# Patient Record
Sex: Male | Born: 1935 | Race: White | Hispanic: No | Smoking: Former smoker
Health system: Southern US, Community
[De-identification: ages and names within clinical notes are randomized; demographics above are authoritative.]

## PROBLEM LIST (undated history)

## (undated) DIAGNOSIS — K219 Gastro-esophageal reflux disease without esophagitis: Secondary | ICD-10-CM

## (undated) DIAGNOSIS — E119 Type 2 diabetes mellitus without complications: Secondary | ICD-10-CM

## (undated) DIAGNOSIS — R809 Proteinuria, unspecified: Secondary | ICD-10-CM

## (undated) DIAGNOSIS — D649 Anemia, unspecified: Secondary | ICD-10-CM

## (undated) DIAGNOSIS — K222 Esophageal obstruction: Secondary | ICD-10-CM

## (undated) DIAGNOSIS — I701 Atherosclerosis of renal artery: Secondary | ICD-10-CM

## (undated) DIAGNOSIS — D696 Thrombocytopenia, unspecified: Secondary | ICD-10-CM

## (undated) DIAGNOSIS — E785 Hyperlipidemia, unspecified: Secondary | ICD-10-CM

## (undated) DIAGNOSIS — E538 Deficiency of other specified B group vitamins: Secondary | ICD-10-CM

## (undated) DIAGNOSIS — I10 Essential (primary) hypertension: Secondary | ICD-10-CM

## (undated) DIAGNOSIS — K5792 Diverticulitis of intestine, part unspecified, without perforation or abscess without bleeding: Secondary | ICD-10-CM

## (undated) DIAGNOSIS — N183 Chronic kidney disease, stage 3 unspecified: Secondary | ICD-10-CM

## (undated) DIAGNOSIS — M109 Gout, unspecified: Secondary | ICD-10-CM

## (undated) HISTORY — DX: Atherosclerosis of renal artery: I70.1

## (undated) HISTORY — PX: EYE SURGERY: SHX253

## (undated) HISTORY — PX: ESOPHAGEAL DILATION: SHX303

## (undated) HISTORY — DX: Anemia, unspecified: D64.9

## (undated) HISTORY — DX: Diverticulitis of intestine, part unspecified, without perforation or abscess without bleeding: K57.92

## (undated) HISTORY — DX: Thrombocytopenia, unspecified: D69.6

## (undated) HISTORY — DX: Esophageal obstruction: K22.2

## (undated) HISTORY — DX: Chronic kidney disease, stage 3 unspecified: N18.30

## (undated) HISTORY — DX: Gout, unspecified: M10.9

## (undated) HISTORY — DX: Proteinuria, unspecified: R80.9

## (undated) HISTORY — DX: Deficiency of other specified B group vitamins: E53.8

## (undated) HISTORY — DX: Chronic kidney disease, stage 3 (moderate): N18.3

## (undated) HISTORY — PX: HERNIA REPAIR: SHX51

## (undated) HISTORY — DX: Type 2 diabetes mellitus without complications: E11.9

---

## 2002-10-13 ENCOUNTER — Encounter: Payer: Self-pay | Admitting: Internal Medicine

## 2002-10-13 ENCOUNTER — Encounter (INDEPENDENT_AMBULATORY_CARE_PROVIDER_SITE_OTHER): Payer: Self-pay | Admitting: Specialist

## 2002-10-13 ENCOUNTER — Ambulatory Visit (HOSPITAL_COMMUNITY): Admission: RE | Admit: 2002-10-13 | Discharge: 2002-10-13 | Payer: Self-pay | Admitting: Internal Medicine

## 2006-05-03 ENCOUNTER — Ambulatory Visit: Payer: Self-pay | Admitting: Internal Medicine

## 2006-05-12 ENCOUNTER — Encounter (INDEPENDENT_AMBULATORY_CARE_PROVIDER_SITE_OTHER): Payer: Self-pay | Admitting: Specialist

## 2006-05-12 ENCOUNTER — Ambulatory Visit: Payer: Self-pay | Admitting: Internal Medicine

## 2006-05-18 ENCOUNTER — Ambulatory Visit: Payer: Self-pay | Admitting: Internal Medicine

## 2006-06-10 ENCOUNTER — Ambulatory Visit: Payer: Self-pay | Admitting: Internal Medicine

## 2006-06-10 ENCOUNTER — Encounter (INDEPENDENT_AMBULATORY_CARE_PROVIDER_SITE_OTHER): Payer: Self-pay | Admitting: Specialist

## 2006-07-05 ENCOUNTER — Ambulatory Visit: Payer: Self-pay | Admitting: Internal Medicine

## 2006-07-22 ENCOUNTER — Ambulatory Visit: Payer: Self-pay | Admitting: Internal Medicine

## 2006-11-25 ENCOUNTER — Ambulatory Visit: Payer: Self-pay | Admitting: Internal Medicine

## 2007-01-07 ENCOUNTER — Ambulatory Visit: Payer: Self-pay | Admitting: Internal Medicine

## 2009-04-23 ENCOUNTER — Ambulatory Visit: Payer: Self-pay | Admitting: Internal Medicine

## 2009-04-23 ENCOUNTER — Telehealth: Payer: Self-pay | Admitting: Internal Medicine

## 2009-04-25 ENCOUNTER — Ambulatory Visit (HOSPITAL_COMMUNITY): Admission: RE | Admit: 2009-04-25 | Discharge: 2009-04-25 | Payer: Self-pay | Admitting: Internal Medicine

## 2009-04-25 ENCOUNTER — Encounter: Payer: Self-pay | Admitting: Internal Medicine

## 2009-04-30 ENCOUNTER — Telehealth (INDEPENDENT_AMBULATORY_CARE_PROVIDER_SITE_OTHER): Payer: Self-pay | Admitting: *Deleted

## 2009-05-01 ENCOUNTER — Ambulatory Visit: Payer: Self-pay | Admitting: Internal Medicine

## 2009-05-07 ENCOUNTER — Encounter: Payer: Self-pay | Admitting: Internal Medicine

## 2009-05-07 DIAGNOSIS — K222 Esophageal obstruction: Secondary | ICD-10-CM

## 2009-05-31 ENCOUNTER — Ambulatory Visit: Payer: Self-pay | Admitting: Internal Medicine

## 2009-05-31 ENCOUNTER — Ambulatory Visit (HOSPITAL_COMMUNITY): Admission: RE | Admit: 2009-05-31 | Discharge: 2009-05-31 | Payer: Self-pay | Admitting: Internal Medicine

## 2009-06-03 ENCOUNTER — Encounter: Payer: Self-pay | Admitting: Internal Medicine

## 2009-06-05 ENCOUNTER — Telehealth (INDEPENDENT_AMBULATORY_CARE_PROVIDER_SITE_OTHER): Payer: Self-pay | Admitting: *Deleted

## 2009-07-03 ENCOUNTER — Ambulatory Visit: Payer: Self-pay | Admitting: Internal Medicine

## 2009-07-03 ENCOUNTER — Ambulatory Visit (HOSPITAL_COMMUNITY): Admission: RE | Admit: 2009-07-03 | Discharge: 2009-07-03 | Payer: Self-pay | Admitting: Internal Medicine

## 2009-11-18 ENCOUNTER — Telehealth: Payer: Self-pay | Admitting: Internal Medicine

## 2009-12-03 ENCOUNTER — Telehealth: Payer: Self-pay | Admitting: Internal Medicine

## 2009-12-05 ENCOUNTER — Encounter: Payer: Self-pay | Admitting: Internal Medicine

## 2010-01-14 ENCOUNTER — Ambulatory Visit (HOSPITAL_COMMUNITY): Admission: RE | Admit: 2010-01-14 | Discharge: 2010-01-14 | Payer: Self-pay | Admitting: Internal Medicine

## 2010-01-14 ENCOUNTER — Ambulatory Visit: Payer: Self-pay | Admitting: Internal Medicine

## 2010-01-15 ENCOUNTER — Encounter (INDEPENDENT_AMBULATORY_CARE_PROVIDER_SITE_OTHER): Payer: Self-pay | Admitting: *Deleted

## 2010-01-15 ENCOUNTER — Telehealth (INDEPENDENT_AMBULATORY_CARE_PROVIDER_SITE_OTHER): Payer: Self-pay | Admitting: *Deleted

## 2010-02-03 ENCOUNTER — Telehealth: Payer: Self-pay | Admitting: Internal Medicine

## 2010-04-08 ENCOUNTER — Ambulatory Visit: Payer: Self-pay | Admitting: Internal Medicine

## 2010-04-08 ENCOUNTER — Ambulatory Visit (HOSPITAL_COMMUNITY): Admission: RE | Admit: 2010-04-08 | Discharge: 2010-04-08 | Payer: Self-pay | Admitting: Internal Medicine

## 2010-04-22 ENCOUNTER — Telehealth (INDEPENDENT_AMBULATORY_CARE_PROVIDER_SITE_OTHER): Payer: Self-pay | Admitting: *Deleted

## 2010-07-08 ENCOUNTER — Ambulatory Visit (HOSPITAL_COMMUNITY): Admission: RE | Admit: 2010-07-08 | Discharge: 2010-07-08 | Payer: Self-pay | Admitting: Internal Medicine

## 2010-07-08 ENCOUNTER — Ambulatory Visit: Payer: Self-pay | Admitting: Internal Medicine

## 2010-09-16 ENCOUNTER — Telehealth: Payer: Self-pay | Admitting: Internal Medicine

## 2010-10-09 ENCOUNTER — Telehealth: Payer: Self-pay | Admitting: Internal Medicine

## 2010-10-16 NOTE — Progress Notes (Signed)
Summary: Rsch'd Endo   Phone Note Call from Patient Call back at Home Phone [phone]   Caller: Patient Call For: Dr. Marina Goodell Reason for Call: Talk to Nurse Summary of Call: needs to r/s his endo that is sch'd on 02-13-10 for another date Initial call taken by: Karna Christmas,  Feb 03, 2010 3:14 PM  Follow-up for Phone Call        Message left for pt. to call back to r/s dil   Teryl Lucy RN  Feb 04, 2010 9:41 AM  Procedure r/s to 04/08/10 at 8:30 .Jill ntfd at [facility] . Follow-up by: Teryl Lucy RN,  Feb 04, 2010 10:55 AM

## 2010-10-16 NOTE — Progress Notes (Signed)
   Phone Note Outgoing Call   Summary of Call: Message left for pt. to call back to schedule balloon dil. in Oct. at [facility]. Initial call taken by: Teryl Lucy RN,  April 22, 2010 11:23 AM  Follow-up for Phone Call        Message left again to call back.  Teryl Lucy RN  April 24, 2010 9:16 AM Pt. ret'd call and he will check with his daughter [id number] for EGD/Dil.  Teryl Lucy RN  April 24, 2010 3:40 PM Scheduled for balloon/dil on 07/08/10 at [facility]..Given instructions via phone. Follow-up by: Teryl Lucy RN,  May 01, 2010 3:13 PM

## 2010-10-16 NOTE — Progress Notes (Signed)
Summary: Medication refill  Medications Added OMEPRAZOLE 20 MG CPDR (OMEPRAZOLE) 2 capsules by mouth daily       Phone Note Call from Patient Call back at Home Phone [phone]   Caller: Patient Call For: Dr. Marina Goodell Reason for Call: Talk to Nurse Summary of Call: Pt needs a refill of omeprazole tp [facility] [address] Initial call taken by: [place] Johnson,  October 09, 2010 11:31 AM  Follow-up for Phone Call        rx sent I have the RX again. Follow-up by: Darcey Nora RN, CGRN,  October 09, 2010 1:52 PM    New/Updated Medications: OMEPRAZOLE 20 MG CPDR (OMEPRAZOLE) 2 capsules by mouth daily Prescriptions: OMEPRAZOLE 20 MG CPDR (OMEPRAZOLE) 2 capsules by mouth daily  #60 x 10   Entered by:   Darcey Nora RN, CGRN   Authorized by:   Hilarie Fredrickson MD   Signed by:   Darcey Nora RN, CGRN on 10/09/2010   Method used:   Electronically to        [facility] (retail)       [address]       [place], [place]  [address]       Ph: [phone]       Fax: [phone]   RxID:   [id number]

## 2010-10-16 NOTE — Procedures (Signed)
Summary: Upper Endoscopy  Patient: Tanner Owen Note: All result statuses are Final unless otherwise noted.  Tests: (1) Upper Endoscopy (EGD)   EGD Upper Endoscopy       DONE     [facility]     [address] [place], [place]  [address]           ENDOSCOPY PROCEDURE REPORT           PATIENT:  Tanner Owen, Tanner Owen  MR#:  [id number]     BIRTHDATE:  11/04/1928, 80 yrs. old  GENDER:  male           ENDOSCOPIST:  Wilhemina Bonito. Eda Keys, MD     Referred by:  .Direct Self,           PROCEDURE DATE:  01/14/2010     PROCEDURE:  EGD with balloon dilatation - 3mm,16.5mm     ASA CLASS:  Class II     INDICATIONS:  dilation of esophageal stricture, dysphagia           MEDICATIONS:   Fentanyl 50 mcg, Versed 5 mg IV     TOPICAL ANESTHETIC:  Cetacaine Spray           DESCRIPTION OF PROCEDURE:   After the risks benefits and     alternatives of the procedure were thoroughly explained, informed     consent was obtained.  The Pentax Gastroscope [id number] endoscope     was introduced through the mouth and advanced to the second     portion of the duodenum, without limitations.  The instrument was     slowly withdrawn as the mucosa was fully examined.     <<PROCEDUREIMAGES>>           A tight 7mm stricture was found in the distal esophagus. The     endoscope would not pass into the stomach (not attempted).     THERAPY: 15MM-16.5MM TTS SEQUENTIAL BALLOON WAS INFLATED IN     SEPERATE SESSIONS. MODERATE RESISTANCE AND HEME WITH LOCAL TRAUMA.     The endoscope was then passed into the stomach. Otherwise the     examination was unremarkable.    Retroflexed views revealed a     hiatal hernia.    The scope was then withdrawn from the patient     and the procedure completed.           COMPLICATIONS:  None           ENDOSCOPIC IMPRESSION:     1) Stricture in the distal esophagus -S/P DILATION W/15-16.5MM     BALLOON     2) Otherwise unremarkable examination     3) A small hiatal hernia     4)  Gerd     RECOMMENDATIONS:     1) Clear liquids until 11 am, then soft foods rest of day.     Resume prior diet tomorrow.     2) continue current medications for reflux     3) EGD/DILATION   at HOSPITAL in 6-8 weeks           ______________________________     Wilhemina Bonito. Eda Keys, MD           CC:  Rodrigo Ran, MD, The Patient           n.     eSIGNED:   Wilhemina Bonito. Eda Keys at 01/14/2010 09:25 AM           Levonne Lapping, [id number]  Note: An exclamation mark (!) indicates a result that was not dispersed into the flowsheet. Document Creation Date: 01/14/2010 9:26 AM _______________________________________________________________________  (1) Order result status: Final Collection or observation date-time: 01/14/2010 09:15 Requested date-time:  Receipt date-time:  Reported date-time:  Referring Physician:   Ordering Physician: Fransico Setters [phone]) Specimen Source:  Source: Launa Grill Order Number: [id number] Lab site:

## 2010-10-16 NOTE — Progress Notes (Signed)
Summary: sprak to nurse   Phone Note Call from Patient Call back at Home Phone [phone]   Caller: Patient Call For: Marina Goodell Reason for Call: Talk to Nurse Summary of Call: Patietn wants to speak to nurse regarding appt Initial call taken by: Tawni Levy,  December 03, 2009 3:15 PM  Follow-up for Phone Call        Pt. given available dates for first week of May to have balloon dil. at hosp. and will call back after he talks to his children to see who can bring him. Follow-up by: Teryl Lucy RN,  December 03, 2009 3:30 PM     Appended Document: sprak to nurse  Pt. scheduled for balloon-dil on 01/14/2010 at 8:30 am.at [facility].Given instructions on the phone.

## 2010-10-16 NOTE — Progress Notes (Signed)
   Phone Note Outgoing Call   Call placed by: Milford Cage NCMA,  September 16, 2010 8:40 AM Call placed to: Patient Summary of Call: called patient to schedule follow-up EGD with Dil. Pt. declined.   He states that he is doing fine at this time and will call if he starts having problems to schedule.   Initial call taken by: Milford Cage NCMA,  September 16, 2010 8:42 AM

## 2010-10-16 NOTE — Procedures (Signed)
Summary: Upper Endoscopy  Patient: Tanner Owen Note: All result statuses are Final unless otherwise noted.  Tests: (1) Upper Endoscopy (EGD)   EGD Upper Endoscopy       DONE     [facility]     [address] [place], [place]  [address]           ENDOSCOPY PROCEDURE REPORT           PATIENT:  [name], [name]  MR#:  [id number]     BIRTHDATE:  [birth year], 81 yrs. old  GENDER:  male           ENDOSCOPIST:  Wilhemina Bonito. Eda Keys, MD     Referred by:  .Direct           PROCEDURE DATE:  07/08/2010     PROCEDURE:  EGD with balloon dilatation - 15-16-17MM     ASA CLASS:  Class II     INDICATIONS:  dilation of esophageal stricture           MEDICATIONS:   Fentanyl 50 mcg IV, Versed 6 mg IV     TOPICAL ANESTHETIC:  Cetacaine Spray           DESCRIPTION OF PROCEDURE:   After the risks benefits and     alternatives of the procedure were thoroughly explained, informed     consent was obtained.  The Pentax Gastroscope [id number] endoscope     was introduced through the mouth and advanced to the second     portion of the duodenum, without limitations.  The instrument was     slowly withdrawn as the mucosa was fully examined.     <<PROCEDUREIMAGES>>           A 9mm - 10mm stricture was found in the distal esophagus.     Otherwise the examination was normal to D2.    Retroflexed views     revealed a small hiatal hernia.           THERAPY: SEQUENTIAL INDIVIDUAL DILATION OF TTS BALLOON -     15MM-16MM-17MM. MODERATE RESISTANCE AND MILD HEME. TOLERATED WELL     COMPLICATIONS:  None           ENDOSCOPIC IMPRESSION:     1) Stricture in the distal esophagus - S/P DILT TO     2) Otherwise normal examination     3) Gerd     RECOMMENDATIONS:     1) Clear liquids until 1PM, then soft foods rest of day. Resume     prior diet tomorrow.     2) continue current medications FOR REFLUX     3) REPEAT Endoscopy WITH DILATION IN 2-3 MONTHS           ______________________________  Wilhemina Bonito. Eda Keys, MD           CC:  Rodrigo Ran, MD, The Patient           n.     eSIGNED:   Wilhemina Bonito. Eda Keys at 07/08/2010 10:59 AM           Levonne Lapping, [id number]  Note: An exclamation mark (!) indicates a result that was not dispersed into the flowsheet. Document Creation Date: 07/08/2010 10:59 AM _______________________________________________________________________  (1) Order result status: Final Collection or observation date-time: 07/08/2010 10:50 Requested date-time:  Receipt date-time:  Reported date-time:  Referring Physician:   Ordering Physician: Fransico Setters [phone]) Specimen Source:  Source: Launa Grill Order  Number: [id number] Lab site:

## 2010-10-16 NOTE — Letter (Signed)
Summary: EGD Instructions  [facility]  [address] [place], [place] [address]   Phone: [phone]  Fax: [phone]       Tanner Owen    08-25-1929    MRN: [id number]       Procedure Day /Date:03/04/2010     Arrival Time: 7:30 a.m.     Procedure Time:8:30 am     Location of Procedure:                    _  [facility] (4th [facility]) _  X_ [facility] ( Outpatient Registration) _  _ [facility] (Short Stay on [address]   PREPARATION FOR ENDOSCOPY/Dilation   On Tuesday THE DAY OF THE PROCEDURE:  1.   No solid foods, milk or milk products are allowed after midnight the night before your procedure.  2.   Do not drink anything colored red or purple.  Avoid juices with pulp.  No orange juice.  3.  You may drink clear liquids until  4:30 a.m., which is 4 hours before your procedure.                                                                                                CLEAR LIQUIDS INCLUDE: Water Jello Ice Popsicles Tea (sugar ok, no milk/cream) Powdered fruit flavored drinks Coffee (sugar ok, no milk/cream) Gatorade Juice: apple, white grape, white cranberry  Lemonade Clear bullion, consomm, broth Carbonated beverages (any kind) Strained chicken noodle soup Hard Candy   MEDICATION INSTRUCTIONS  Unless otherwise instructed, you should take regular prescription medications with a small sip of water as early as possible the morning of your procedure.           OTHER INSTRUCTIONS  You will need a responsible adult at least 75 years of age to accompany you and drive you home.   This person must remain in the waiting room during your procedure.  Wear loose fitting clothing that is easily removed.  Leave jewelry and other valuables at home.  However, you may wish to bring a book to read or an iPod/MP3 player to listen to music as you wait for your procedure to start.  Remove all body piercing jewelry  and leave at home.  Total time from sign-in until discharge is approximately 2-3 hours.  You should go home directly after your procedure and rest.  You can resume normal activities the day after your procedure.  The day of your procedure you should not:   Drive   Make legal decisions   Operate machinery   Drink alcohol   Return to work  You will receive specific instructions about eating, activities and medications before you leave.    The above instructions have been reviewed and explained to me by   [facility] phone 5/4/11____________    I fully understand and can verbalize these instructions _____________________________ Date _________

## 2010-10-16 NOTE — Procedures (Signed)
Summary: Upper Endoscopy  Patient: Tanner Owen Note: All result statuses are Final unless otherwise noted.  Tests: (1) Upper Endoscopy (EGD)   EGD Upper Endoscopy       DONE     [facility]     [address] [place], [place]  [address]           ENDOSCOPY PROCEDURE REPORT           PATIENT:  Tanner Owen, Tanner Owen  MR#:  [id number]     BIRTHDATE:  April 25, 1929, 80 yrs. old  GENDER:  male           ENDOSCOPIST:  Wilhemina Bonito. Eda Keys, MD     Referred by:  .Direct           PROCEDURE DATE:  04/08/2010     PROCEDURE:  EGD with balloon dilatation - TTS 15-16.56mm     ASA CLASS:  Class II     INDICATIONS:  dysphagia, dilation of esophageal stricture           MEDICATIONS:   Fentanyl 50 mcg IV, Versed 6 mg     TOPICAL ANESTHETIC:  Cetacaine Spray           DESCRIPTION OF PROCEDURE:   After the risks benefits and     alternatives of the procedure were thoroughly explained, informed     consent was obtained.  The Gastroscope [id number]     endoscope was introduced through the mouth and advanced to the     second portion of the duodenum, without limitations.  The     instrument was slowly withdrawn as the mucosa was fully examined.     <<PROCEDUREIMAGES>>           A tight focal stricture measuring about 6mm was found in the     distal esophagus. After dilation  with a TTS balloon 15mm x 2,     then 16.5 mm x 2 the stomach was entered.  Otherwise the     examination was normal.    Retroflexed views revealed no     abnormalities.    The scope was then withdrawn from the patient     and the procedure completed.           COMPLICATIONS:  None           ENDOSCOPIC IMPRESSION:     1) Stricture in the distal esophagus - s/p dilation to 16.73mm     2) Otherwise normal examination     3) Gerd     RECOMMENDATIONS:     1) Clear liquids until 11am, then soft foods rest of day. Resume     prior diet tomorrow.     2) continue current medications for reflux     3) Repear endoscopy with  balloon dilation in 6-8 weeks           ______________________________     Wilhemina Bonito. Eda Keys, MD           CC:  Rodrigo Ran, MD, The Patient           n.     eSIGNED:   Wilhemina Bonito. Eda Keys at 04/08/2010 09:06 AM           Levonne Lapping, [id number]  Note: An exclamation mark (!) indicates a result that was not dispersed into the flowsheet. Document Creation Date: 04/08/2010 9:07 AM _______________________________________________________________________  (1) Order result status: Final Collection or observation date-time: 04/08/2010 08:56  Requested date-time:  Receipt date-time:  Reported date-time:  Referring Physician:   Ordering Physician: Fransico Setters [phone]) Specimen Source:  Source: Launa Grill Order Number: [id number] Lab site:

## 2010-10-16 NOTE — Miscellaneous (Signed)
Summary: EGD/Dil order  Clinical Lists Changes  Orders: Added new Test order of ZEGD Balloon Dil (ZEGD Balloon) - Signed

## 2010-10-16 NOTE — Progress Notes (Signed)
   Phone Note Outgoing Call   Summary of Call: Pt. scheduled for balloon dil again on June 21/2011at 8:30 am at [facility].Pt.is aware and instructed. Initial call taken by: Teryl Lucy RN,  Jan 15, 2010 2:50 PM

## 2010-10-16 NOTE — Progress Notes (Signed)
Summary: sch egd at [facility]   Phone Note Call from Patient Call back at Home Phone [phone]   Caller: Patient Call For: Marina Goodell Reason for Call: Talk to Nurse Summary of Call: Patient needs to scheduled f.u egd at hosp Initial call taken by: Tawni Levy,  November 18, 2009 3:36 PM  Follow-up for Phone Call         Dr.Perry is not in the hospital in April so he will call back to schedule in May as he says Dr.Perry says he is to have procedure 2x/yr. NO dysphagia currently. Follow-up by: Teryl Lucy RN,  November 19, 2009 8:01 AM

## 2010-10-16 NOTE — Progress Notes (Signed)
 ----   Converted from flag ---- ---- 07/08/2010 4:41 PM, Milford Cage NCMA wrote: schedule patient for EGD with Dilt. at [facility]. Stricture/GERD ------------------------------  Phone Note Outgoing Call   Call placed by: Milford Cage NCMA,  September 16, 2010 8:53 AM Call placed to: Patient Summary of Call: pt. declined.  see phone note. Initial call taken by: Milford Cage NCMA,  September 16, 2010 8:53 AM

## 2010-11-26 LAB — GLUCOSE, CAPILLARY: Glucose-Capillary: 168 mg/dL — ABNORMAL HIGH (ref 70–99)

## 2010-12-02 LAB — GLUCOSE, CAPILLARY: Glucose-Capillary: 150 mg/dL — ABNORMAL HIGH (ref 70–99)

## 2010-12-19 LAB — GLUCOSE, CAPILLARY: Glucose-Capillary: 125 mg/dL — ABNORMAL HIGH (ref 70–99)

## 2011-01-30 NOTE — Assessment & Plan Note (Signed)
[facility]                           GASTROENTEROLOGY OFFICE NOTE   NAME:Tanner Owen                     MRN:          [id number]  DATE:05/03/2006                            DOB:          05/27/1930    REFERRING PHYSICIAN:  Loraine Leriche A. Perini, MD   REASON FOR CONSULTATION:  Dysphagia.   HISTORY:  This is a 77 year old white male with a history of hypertension,  diabetes mellitus, adenomatous colon polyps, and gastroesophageal reflux  disease complicated by peptic stricture, who was referred through the  courtesy of Dr. Waynard Edwards regarding dysphagia.  The patient's old chart is not  currently available for review; however, some old records were retrieved  from the computer database.  The patient was evaluated in December, 2003 for  weight loss and dysphagia.  On August 17, 2002 he underwent colonoscopy and  upper endoscopy.  Colonoscopy revealed multiple colon polyps measuring as  large as 15 mm.  He was found to have tubulovillous and villous adenomas.  Followup in one year recommended.  Patient acknowledges receiving a recall  letter but consciously chose not to follow up.  His upper endoscopy on that  same day revealed distal esophageal stricture as well as a large squamous  papilloma.  Followup endoscopy a month later found that the previous polyp  had autoamputated with only a small stock present.  Biopsies of that small  stock confirmed squamous papilloma.  The distal esophagus was dilated with a  series of Savary dilators.  His dysphagia apparently resolved.  He was  placed on Nexium, which he took for a while, then discontinued.  It is not  clear that he had office followup, as requested.  In any event, he states  that he has been on Nexium over the past few years fairly routinely.  Over  the past month or so, he has noticed intermittent dysphagia to solids.  He  has had a 10 pound weight loss over the past six months.  His only other  complaints are increased intestinal gas.   PAST MEDICAL HISTORY:  As above.   PAST SURGICAL HISTORY:  None.   FAMILY HISTORY:  Negative for GI malignancy.   SOCIAL HISTORY:  Divorced with three children.  Eighth grade education.  Does not smoke or use alcohol.   REVIEW OF SYSTEMS:  Per diagnostic evaluation form.   ALLERGIES:  No known drug allergies.   CURRENT MEDICATIONS:  1. Hydrochlorothiazide 25 mg daily.  2. Propranolol 80 mg b.i.d.  3. Enalapril 5 mg daily.  4. Aspirin 81 mg daily.  5. Crestor 2.5 mg daily.  6. Nexium 40 mg daily.  7. Metformin 500 mg daily.  8. Tylenol p.r.n.   PHYSICAL EXAMINATION:  VITAL SIGNS:  Blood pressure 120/80, heart rate 68  and regular.  Weight is 162.2 pounds.  He is 5 feet 11 inches in height.  GENERAL:  Elderly man in no acute distress.  HEENT:  Sclerae are anicteric.  Conjunctivae are pink.  Oral mucosa intact.  There is no thrush.  No adenopathy.  LUNGS:  Clear.  HEART:  Regular.  ABDOMEN:  Soft without tenderness, mass, or hernia.  Good bowel sounds  heard.  EXTREMITIES:  Without edema.   IMPRESSION:  1. A history of gastroesophageal reflux disease complicated by peptic      stricture.  2. History of squamous papilloma of the esophagus.  3. Currently with recurrent dysphagia, likely due to recurrent peptic      stricture.  4. History of multiple villous adenomatous colon polyps, overdue for      surveillance, acknowledged by patient.  5. Weight loss of uncertain cause.   RECOMMENDATIONS:  1. Continue Nexium.  2. Schedule upper endoscopy with possible esophageal dilation.  The nature      of the procedure as well as the risks, benefits and alternatives were      reviewed.  He understood and agreed to proceed.  3. Discussed in detail the importance of surveillance colonoscopy.  The      patient mentioned that he had an aversion to the preparation.  I      offered him multiple prep options.  He still declined to proceed at       this time, even though I informed him that he may be at risk for colon      cancer.  He said he might consider it after his upper endoscopic      procedure is completed.  4. Ongoing general medical care with Dr. Waynard Edwards.                                   Wilhemina Bonito. Eda Keys., MD   JNP/MedQ  DD:  05/03/2006  DT:  05/04/2006  Job #:  [id number]   cc:   Loraine Leriche A. Perini, MD

## 2011-01-30 NOTE — Assessment & Plan Note (Signed)
[facility]                         GASTROENTEROLOGY OFFICE NOTE   NAME:Tanner Owen, Tanner Owen                    MRN:          [id number]  DATE:11/25/2006                            DOB:          November 01, 1928    REASON FOR CONSULTATION:  Hemoccult positive stool.   HISTORY:  This is a 77 year old white male with a history of  hypertension, diabetes mellitus, adenomatous colon polyps, and  gastroesophageal reflux disease complicated by erosive esophagitis and  peptic stricture for which the patient has undergone a prior endoscopy  with esophageal dilation.  He is now referred through the courtesy of  Dr. Waynard Edwards regarding hemoccult positive stool.  The patient was last  evaluated in November 2007, when he underwent upper endoscopy with  esophageal dilation of a distal esophageal stricture.  Since that time,  he has been maintained on daily proton pump inhibitor therapy in the  form of omeprazole.  He denied indigestion or heartburn.  He has had  minimal problems with recurrent dysphagia.  He has had weight gain.  He  recently saw Dr. Waynard Edwards and was found to have hemoccult positive stool  on home testing.  A CBC revealed a normal hemoglobin of 13.3.   The patient's GI review of systems is currently remarkable for  intermittent problems with constipation for which he takes Ex-Lax and  MiraLax as needed.  He denies any significant abdominal pain.  No gross  GI bleeding.   He is aware that he is over due for surveillance colonoscopy, having  declined previously.  He is now interested in that exam.   PAST MEDICAL HISTORY:  As above.   PAST SURGICAL HISTORY:  None.   FAMILY HISTORY:  Negative for GI malignancy.   SOCIAL HISTORY:  Divorced with 3 children.  Eighth grade education.  He  does not smoke or use alcohol.   ALLERGIES:  NO KNOWN DRUG ALLERGIES.   CURRENT MEDICATIONS:  1. Propranolol 160 mg daily.  2. Hydrochlorothiazide 25 mg daily.  3.  Enalapril 5 mg daily.  4. Aspirin 81 mg daily.  5. Metformin 500 mg daily.  6. Crestor 5 mg daily.  7. Omeprazole 40 mg daily.  8. He also uses Ex-Lax and MiraLax p.r.n.   PHYSICAL EXAMINATION:  GENERAL:  Well-appearing male, no acute distress.  VITAL SIGNS:  Blood pressure 158/88, heart rate is 56 and regular,  weight is 164.6 pounds.  HEENT:  Sclerae are anicteric.  Conjunctivae are pink.  Oral mucosa  intact.  No adenopathy.  LUNGS:  Clear.  HEART:  Regular.  ABDOMEN:  Soft without tenderness, mass, or hernia.  Good bowel sounds  heard.  EXTREMITIES:  Without edema.   IMPRESSION:  1. Hemoccult positive stool.  This could be related to an occult      colonic lesion.  Alternatively, this may be related to erosive      esophagitis despite omeprazole therapy.  He is not anemic.  2. Constipation, likely functional, rule out occult colonic lesion.  3. History of adenomatous colon polyps overdue for surveillance.  4. Diabetes mellitus and other general medical problems.   RECOMMENDATIONS:  1. Continue omeprazole for reflux symptoms.  2. Colonoscopy with polypectomy if necessary.  The nature of the      procedure as well as the risks, benefits, and alternatives were      discussed in great detail.  He understood and agreed to proceed.  3. Hold Metformin the day of his procedure.  4. Repeat upper endoscopy indicated if the patient develops any      significant recurrent dysphagia.  5. Ongoing general medical care with Dr. Waynard Edwards.     Wilhemina Bonito. Marina Goodell, MD  Electronically Signed    JNP/MedQ  DD: 11/25/2006  DT: 11/26/2006  Job #: [id number]   cc:   Loraine Leriche A. Perini, M.D.

## 2011-06-04 ENCOUNTER — Encounter (HOSPITAL_BASED_OUTPATIENT_CLINIC_OR_DEPARTMENT_OTHER): Payer: Medicare Other | Admitting: Oncology

## 2011-06-04 ENCOUNTER — Other Ambulatory Visit: Payer: Self-pay | Admitting: Oncology

## 2011-06-04 ENCOUNTER — Encounter: Payer: Self-pay | Admitting: Oncology

## 2011-06-04 DIAGNOSIS — D539 Nutritional anemia, unspecified: Secondary | ICD-10-CM

## 2011-06-04 DIAGNOSIS — D6959 Other secondary thrombocytopenia: Secondary | ICD-10-CM

## 2011-06-04 LAB — CBC WITH DIFFERENTIAL/PLATELET
Eosinophils Absolute: 0.1 10*3/uL (ref 0.0–0.5)
MCV: 81.4 fL (ref 79.3–98.0)
MONO%: 9.2 % (ref 0.0–14.0)
NEUT#: 3.6 10*3/uL (ref 1.5–6.5)
RBC: 4.13 10*6/uL — ABNORMAL LOW (ref 4.20–5.82)
RDW: 14.9 % — ABNORMAL HIGH (ref 11.0–14.6)
WBC: 5.3 10*3/uL (ref 4.0–10.3)
lymph#: 1.1 10*3/uL (ref 0.9–3.3)

## 2011-06-04 LAB — CHCC SMEAR

## 2011-06-05 LAB — COMPREHENSIVE METABOLIC PANEL
BUN: 23 mg/dL (ref 6–23)
CO2: 31 mEq/L (ref 19–32)
Glucose, Bld: 222 mg/dL — ABNORMAL HIGH (ref 70–99)
Sodium: 132 mEq/L — ABNORMAL LOW (ref 135–145)
Total Bilirubin: 1.1 mg/dL (ref 0.3–1.2)
Total Protein: 6.9 g/dL (ref 6.0–8.3)

## 2011-06-05 LAB — IRON AND TIBC
%SAT: 22 % (ref 20–55)
Iron: 82 ug/dL (ref 42–165)
UIBC: 298 ug/dL (ref 125–400)

## 2011-06-05 LAB — ERYTHROPOIETIN: Erythropoietin: 20.4 m[IU]/mL (ref 2.6–34.0)

## 2011-06-05 LAB — LACTATE DEHYDROGENASE: LDH: 120 U/L (ref 94–250)

## 2011-06-05 LAB — VITAMIN B12: Vitamin B-12: 265 pg/mL (ref 211–911)

## 2011-08-27 ENCOUNTER — Other Ambulatory Visit: Payer: Self-pay | Admitting: Internal Medicine

## 2011-12-23 ENCOUNTER — Other Ambulatory Visit: Payer: Self-pay | Admitting: Internal Medicine

## 2012-01-01 ENCOUNTER — Encounter (HOSPITAL_COMMUNITY): Payer: Self-pay

## 2012-01-01 ENCOUNTER — Encounter (INDEPENDENT_AMBULATORY_CARE_PROVIDER_SITE_OTHER): Payer: Medicare Other | Admitting: Ophthalmology

## 2012-01-01 DIAGNOSIS — H1820 Unspecified corneal edema: Secondary | ICD-10-CM

## 2012-01-01 DIAGNOSIS — H33309 Unspecified retinal break, unspecified eye: Secondary | ICD-10-CM

## 2012-01-01 DIAGNOSIS — H2700 Aphakia, unspecified eye: Secondary | ICD-10-CM

## 2012-01-01 DIAGNOSIS — S0550XA Penetrating wound with foreign body of unspecified eyeball, initial encounter: Secondary | ICD-10-CM

## 2012-01-01 DIAGNOSIS — H43819 Vitreous degeneration, unspecified eye: Secondary | ICD-10-CM

## 2012-01-01 NOTE — H&P (Signed)
Tanner Owen is an 78 y.o. male.   Chief Complaint: Poor vision OS since cataract surgery HPI: Cataract surgery OS 12-30-2011 and there is retained lens material in the eye  No past medical history on file.  No past surgical history on file.  No family history on file. Social History:  does not have a smoking history on file. He does not have any smokeless tobacco history on file. His alcohol and drug histories not on file.  Allergies: Allergies not on file  Medications Prior to Admission  Medication Sig Dispense Refill  . omeprazole (PRILOSEC) 20 MG capsule TAKE TWO CAPSULES BY MOUTH DAILY  60 capsule  0   No current facility-administered medications on file as of 01/01/2012.    Review of systems otherwise negative  There were no vitals taken for this visit.  Physical exam: Mental status: oriented x3. Eyes: See eye exam associated with this date of surgery in media tab.  Scanned in by scanning center Ears, Nose, Throat: within normal limits Neck: Within Normal limits General: within normal limits Chest: Within normal limits Breast: deferred Heart: Within normal limits Abdomen: Within normal limits GU: deferred Extremities: within normal limits Skin: within normal limits  Assessment/Plan Retained lens material in vitreous.  Lens nucleus in vitreous Plan: To [facility] for Pars Plana vitrectomy with phacoemulsification and possible suture of secondary implant Left eye  Sherrie George 01/01/2012, 12:49 PM

## 2012-01-04 ENCOUNTER — Encounter (INDEPENDENT_AMBULATORY_CARE_PROVIDER_SITE_OTHER): Payer: Medicare Other | Admitting: Ophthalmology

## 2012-01-04 DIAGNOSIS — H2700 Aphakia, unspecified eye: Secondary | ICD-10-CM

## 2012-01-04 DIAGNOSIS — S0550XA Penetrating wound with foreign body of unspecified eyeball, initial encounter: Secondary | ICD-10-CM

## 2012-01-04 DIAGNOSIS — H43819 Vitreous degeneration, unspecified eye: Secondary | ICD-10-CM

## 2012-01-04 MED ORDER — CEFAZOLIN SODIUM 1-5 GM-% IV SOLN
1.0000 g | INTRAVENOUS | Status: AC
  Administered 2012-01-05: 1 g via INTRAVENOUS
  Filled 2012-01-04: qty 50

## 2012-01-05 ENCOUNTER — Encounter (HOSPITAL_COMMUNITY): Payer: Self-pay | Admitting: Certified Registered Nurse Anesthetist

## 2012-01-05 ENCOUNTER — Ambulatory Visit (HOSPITAL_COMMUNITY): Payer: Medicare Other

## 2012-01-05 ENCOUNTER — Encounter (HOSPITAL_COMMUNITY): Payer: Self-pay | Admitting: *Deleted

## 2012-01-05 ENCOUNTER — Encounter (HOSPITAL_COMMUNITY)
Admission: RE | Disposition: A | Discharge status: Home / Self Care | Payer: Self-pay | Source: Ambulatory Visit | Attending: Ophthalmology

## 2012-01-05 ENCOUNTER — Ambulatory Visit (HOSPITAL_COMMUNITY)
Admission: RE | Admit: 2012-01-05 | Discharge: 2012-01-06 | Disposition: A | Discharge status: Home / Self Care | Payer: Medicare Other | Source: Ambulatory Visit | Attending: Ophthalmology | Admitting: Ophthalmology

## 2012-01-05 ENCOUNTER — Ambulatory Visit (HOSPITAL_COMMUNITY): Payer: Medicare Other | Admitting: Certified Registered Nurse Anesthetist

## 2012-01-05 DIAGNOSIS — K219 Gastro-esophageal reflux disease without esophagitis: Secondary | ICD-10-CM | POA: Insufficient documentation

## 2012-01-05 DIAGNOSIS — H59029 Cataract (lens) fragments in eye following cataract surgery, unspecified eye: Secondary | ICD-10-CM

## 2012-01-05 DIAGNOSIS — I10 Essential (primary) hypertension: Secondary | ICD-10-CM | POA: Insufficient documentation

## 2012-01-05 DIAGNOSIS — Y838 Other surgical procedures as the cause of abnormal reaction of the patient, or of later complication, without mention of misadventure at the time of the procedure: Secondary | ICD-10-CM | POA: Insufficient documentation

## 2012-01-05 DIAGNOSIS — H59022 Cataract (lens) fragments in eye following cataract surgery, left eye: Secondary | ICD-10-CM

## 2012-01-05 DIAGNOSIS — T8529XA Other mechanical complication of intraocular lens, initial encounter: Secondary | ICD-10-CM | POA: Insufficient documentation

## 2012-01-05 DIAGNOSIS — K08109 Complete loss of teeth, unspecified cause, unspecified class: Secondary | ICD-10-CM | POA: Insufficient documentation

## 2012-01-05 DIAGNOSIS — E119 Type 2 diabetes mellitus without complications: Secondary | ICD-10-CM

## 2012-01-05 HISTORY — DX: Gastro-esophageal reflux disease without esophagitis: K21.9

## 2012-01-05 HISTORY — DX: Essential (primary) hypertension: I10

## 2012-01-05 HISTORY — PX: GAS/FLUID EXCHANGE: SHX5334

## 2012-01-05 HISTORY — DX: Hyperlipidemia, unspecified: E78.5

## 2012-01-05 HISTORY — PX: PLACEMENT AND SUTURE OF SECONDARY INTRAOCULAR LENS: SHX5338

## 2012-01-05 HISTORY — PX: PARS PLANA VITRECTOMY: SHX2166

## 2012-01-05 LAB — BASIC METABOLIC PANEL
BUN: 45 mg/dL — ABNORMAL HIGH (ref 6–23)
Chloride: 92 mEq/L — ABNORMAL LOW (ref 96–112)
GFR calc Af Amer: 28 mL/min — ABNORMAL LOW (ref >90)
Potassium: 2.7 mEq/L — CL (ref 3.5–5.1)

## 2012-01-05 LAB — GLUCOSE, CAPILLARY
Glucose-Capillary: 128 mg/dL — ABNORMAL HIGH (ref 70–99)
Glucose-Capillary: 122 mg/dL — ABNORMAL HIGH (ref 70–99)

## 2012-01-05 LAB — CBC
HCT: 37.7 % — ABNORMAL LOW (ref 39.0–52.0)
Hemoglobin: 13.7 g/dL (ref 13.0–17.0)
MCHC: 36.3 g/dL — ABNORMAL HIGH (ref 30.0–36.0)

## 2012-01-05 LAB — SURGICAL PCR SCREEN: MRSA, PCR: NEGATIVE

## 2012-01-05 SURGERY — GAS/FLUID EXCHANGE
Anesthesia: General | Site: Eye | Laterality: Left | Wound class: Clean

## 2012-01-05 MED ORDER — ACETAMINOPHEN 325 MG PO TABS
325.0000 mg | ORAL_TABLET | ORAL | Status: DC | PRN
Start: 2012-01-05 — End: 2012-01-06

## 2012-01-05 MED ORDER — LATANOPROST 0.005 % OP SOLN
1.0000 [drp] | Freq: Every day | OPHTHALMIC | Status: DC
  Filled 2012-01-05: qty 2.5

## 2012-01-05 MED ORDER — PROPOFOL 10 MG/ML IV EMUL
INTRAVENOUS | Status: DC | PRN
  Administered 2012-01-05: 180 mg via INTRAVENOUS

## 2012-01-05 MED ORDER — FENTANYL CITRATE 0.05 MG/ML IJ SOLN
INTRAMUSCULAR | Status: DC | PRN
  Administered 2012-01-05: 100 ug via INTRAVENOUS

## 2012-01-05 MED ORDER — BUPIVACAINE HCL 0.75 % IJ SOLN
INTRAMUSCULAR | Status: DC | PRN
  Administered 2012-01-05: 10 mL

## 2012-01-05 MED ORDER — LINAGLIPTIN 5 MG PO TABS
5.0000 mg | ORAL_TABLET | Freq: Once | ORAL | Status: AC
  Administered 2012-01-06: 5 mg via ORAL
  Filled 2012-01-05: qty 1

## 2012-01-05 MED ORDER — TEMAZEPAM 15 MG PO CAPS: 15.0000 mg | ORAL_CAPSULE | Freq: Every evening | ORAL | Status: DC | PRN

## 2012-01-05 MED ORDER — SODIUM CHLORIDE 0.9 % IJ SOLN
INTRAMUSCULAR | Status: DC | PRN
  Administered 2012-01-05: 15:00:00

## 2012-01-05 MED ORDER — POTASSIUM CHLORIDE 10 MEQ/100ML IV SOLN
10.0000 meq | INTRAVENOUS | Status: AC
  Administered 2012-01-05: 10 meq via INTRAVENOUS
  Filled 2012-01-05 (×3): qty 100

## 2012-01-05 MED ORDER — PROVISC 10 MG/ML IO SOLN
INTRAOCULAR | Status: DC | PRN
  Administered 2012-01-05: .85 mL via INTRAOCULAR

## 2012-01-05 MED ORDER — LIDOCAINE HCL (CARDIAC) 20 MG/ML IV SOLN
INTRAVENOUS | Status: DC | PRN
  Administered 2012-01-05: 80 mg via INTRAVENOUS

## 2012-01-05 MED ORDER — EPHEDRINE SULFATE 50 MG/ML IJ SOLN
INTRAMUSCULAR | Status: DC | PRN
  Administered 2012-01-05 (×2): 5 mg via INTRAVENOUS
  Administered 2012-01-05: 10 mg via INTRAVENOUS
  Administered 2012-01-05: 15 mg via INTRAVENOUS
  Administered 2012-01-05: 10 mg via INTRAVENOUS
  Administered 2012-01-05: 5 mg via INTRAVENOUS

## 2012-01-05 MED ORDER — MAGNESIUM HYDROXIDE 400 MG/5ML PO SUSP: 15.0000 mL | Freq: Four times a day (QID) | ORAL | Status: DC | PRN

## 2012-01-05 MED ORDER — MUPIROCIN 2 % EX OINT
TOPICAL_OINTMENT | Freq: Once | CUTANEOUS | Status: AC
  Administered 2012-01-05: 1 via NASAL

## 2012-01-05 MED ORDER — POTASSIUM CHLORIDE CRYS ER 20 MEQ PO TBCR
20.0000 meq | EXTENDED_RELEASE_TABLET | Freq: Two times a day (BID) | ORAL | Status: DC
  Administered 2012-01-06 (×2): 20 meq via ORAL
  Filled 2012-01-05 (×3): qty 1

## 2012-01-05 MED ORDER — MORPHINE SULFATE 2 MG/ML IJ SOLN: 1.0000 mg | INTRAMUSCULAR | Status: DC | PRN

## 2012-01-05 MED ORDER — SODIUM CHLORIDE 0.9 % IV SOLN
INTRAVENOUS | Status: DC | PRN
  Administered 2012-01-05 (×3): via INTRAVENOUS

## 2012-01-05 MED ORDER — NEOSTIGMINE METHYLSULFATE 1 MG/ML IJ SOLN
INTRAMUSCULAR | Status: DC | PRN
  Administered 2012-01-05: 3.5 mg via INTRAVENOUS

## 2012-01-05 MED ORDER — ONDANSETRON HCL 4 MG/2ML IJ SOLN
INTRAMUSCULAR | Status: DC | PRN
  Administered 2012-01-05: 4 mg via INTRAVENOUS

## 2012-01-05 MED ORDER — HEMOSTATIC AGENTS (NO CHARGE) OPTIME
TOPICAL | Status: DC | PRN
  Administered 2012-01-05: 1 via TOPICAL

## 2012-01-05 MED ORDER — LISINOPRIL 5 MG PO TABS
5.0000 mg | ORAL_TABLET | Freq: Once | ORAL | Status: AC
  Administered 2012-01-06: 5 mg via ORAL
  Filled 2012-01-05: qty 1

## 2012-01-05 MED ORDER — BRIMONIDINE TARTRATE 0.2 % OP SOLN
1.0000 [drp] | Freq: Two times a day (BID) | OPHTHALMIC | Status: DC
  Administered 2012-01-06: 1 [drp] via OPHTHALMIC
  Filled 2012-01-05: qty 5

## 2012-01-05 MED ORDER — ONDANSETRON HCL 4 MG/2ML IJ SOLN: 4.0000 mg | Freq: Four times a day (QID) | INTRAMUSCULAR | Status: DC | PRN

## 2012-01-05 MED ORDER — TROPICAMIDE 1 % OP SOLN
1.0000 [drp] | OPHTHALMIC | Status: AC | PRN
  Administered 2012-01-05 (×3): 1 [drp] via OPHTHALMIC
  Filled 2012-01-05: qty 3

## 2012-01-05 MED ORDER — GATIFLOXACIN 0.5 % OP SOLN
1.0000 [drp] | Freq: Four times a day (QID) | OPHTHALMIC | Status: DC
  Administered 2012-01-06: 1 [drp] via OPHTHALMIC
  Filled 2012-01-05: qty 2.5

## 2012-01-05 MED ORDER — BACITRACIN-POLYMYXIN B 500-10000 UNIT/GM OP OINT
TOPICAL_OINTMENT | OPHTHALMIC | Status: DC | PRN
  Administered 2012-01-05: 1 via OPHTHALMIC

## 2012-01-05 MED ORDER — BSS PLUS IO SOLN
INTRAOCULAR | Status: DC | PRN
  Administered 2012-01-05: 1 via INTRAOCULAR

## 2012-01-05 MED ORDER — GATIFLOXACIN 0.5 % OP SOLN
1.0000 [drp] | OPHTHALMIC | Status: AC | PRN
  Administered 2012-01-05 (×3): 1 [drp] via OPHTHALMIC
  Filled 2012-01-05: qty 2.5

## 2012-01-05 MED ORDER — LIDOCAINE HCL 4 % MT SOLN
OROMUCOSAL | Status: DC | PRN
  Administered 2012-01-05: 4 mL via TOPICAL

## 2012-01-05 MED ORDER — DEXAMETHASONE SODIUM PHOSPHATE 10 MG/ML IJ SOLN
INTRAMUSCULAR | Status: DC | PRN
  Administered 2012-01-05: 10 mg

## 2012-01-05 MED ORDER — PREDNISOLONE ACETATE 1 % OP SUSP
1.0000 [drp] | Freq: Four times a day (QID) | OPHTHALMIC | Status: DC
  Administered 2012-01-06: 1 [drp] via OPHTHALMIC
  Filled 2012-01-05: qty 1
  Filled 2012-01-05: qty 5

## 2012-01-05 MED ORDER — FENTANYL CITRATE 0.05 MG/ML IJ SOLN
25.0000 ug | INTRAMUSCULAR | Status: DC | PRN
  Administered 2012-01-05: 25 ug via INTRAVENOUS
  Administered 2012-01-05: 50 ug via INTRAVENOUS
  Administered 2012-01-05: 25 ug via INTRAVENOUS

## 2012-01-05 MED ORDER — BSS IO SOLN
INTRAOCULAR | Status: DC | PRN
  Administered 2012-01-05 (×3): 15 mL via INTRAOCULAR

## 2012-01-05 MED ORDER — ROCURONIUM BROMIDE 100 MG/10ML IV SOLN
INTRAVENOUS | Status: DC | PRN
  Administered 2012-01-05: 30 mg via INTRAVENOUS

## 2012-01-05 MED ORDER — MIDAZOLAM HCL 5 MG/5ML IJ SOLN
INTRAMUSCULAR | Status: DC | PRN
  Administered 2012-01-05: 1 mg via INTRAVENOUS

## 2012-01-05 MED ORDER — CYCLOPENTOLATE HCL 1 % OP SOLN
1.0000 [drp] | OPHTHALMIC | Status: AC | PRN
  Administered 2012-01-05 (×3): 1 [drp] via OPHTHALMIC
  Filled 2012-01-05: qty 2

## 2012-01-05 MED ORDER — ACETAZOLAMIDE SODIUM 500 MG IJ SOLR
500.0000 mg | Freq: Once | INTRAMUSCULAR | Status: AC
  Administered 2012-01-06: 500 mg via INTRAVENOUS
  Filled 2012-01-05: qty 500

## 2012-01-05 MED ORDER — HYDROCHLOROTHIAZIDE 25 MG PO TABS
25.0000 mg | ORAL_TABLET | Freq: Once | ORAL | Status: AC
  Administered 2012-01-06: 25 mg via ORAL
  Filled 2012-01-05: qty 1

## 2012-01-05 MED ORDER — EPINEPHRINE HCL 1 MG/ML IJ SOLN
INTRAOCULAR | Status: DC | PRN
  Administered 2012-01-05: 15:00:00

## 2012-01-05 MED ORDER — BACITRACIN-POLYMYXIN B 500-10000 UNIT/GM OP OINT
1.0000 "application " | TOPICAL_OINTMENT | Freq: Four times a day (QID) | OPHTHALMIC | Status: DC
  Administered 2012-01-06: 1 via OPHTHALMIC
  Filled 2012-01-05: qty 3.5

## 2012-01-05 MED ORDER — TETRACAINE HCL 0.5 % OP SOLN
2.0000 [drp] | Freq: Once | OPHTHALMIC | Status: DC
  Filled 2012-01-05: qty 2

## 2012-01-05 MED ORDER — MUPIROCIN 2 % EX OINT
TOPICAL_OINTMENT | CUTANEOUS | Status: AC
  Administered 2012-01-05: 1 via NASAL
  Filled 2012-01-05: qty 22

## 2012-01-05 MED ORDER — GLYCOPYRROLATE 0.2 MG/ML IJ SOLN
INTRAMUSCULAR | Status: DC | PRN
  Administered 2012-01-05: .7 mg via INTRAVENOUS
  Administered 2012-01-05: 0.2 mg via INTRAVENOUS

## 2012-01-05 MED ORDER — PHENYLEPHRINE HCL 2.5 % OP SOLN
1.0000 [drp] | OPHTHALMIC | Status: AC | PRN
  Administered 2012-01-05 (×3): 1 [drp] via OPHTHALMIC
  Filled 2012-01-05: qty 3

## 2012-01-05 MED ORDER — OXYCODONE-ACETAMINOPHEN 5-325 MG PO TABS: 1.0000 | ORAL_TABLET | ORAL | Status: DC | PRN

## 2012-01-05 MED ORDER — SODIUM CHLORIDE 0.45 % IV SOLN
INTRAVENOUS | Status: DC
  Administered 2012-01-06: via INTRAVENOUS

## 2012-01-05 MED ORDER — INSULIN ASPART 100 UNIT/ML ~~LOC~~ SOLN
0.0000 [IU] | SUBCUTANEOUS | Status: DC
  Administered 2012-01-06: 3 [IU] via SUBCUTANEOUS
  Administered 2012-01-06: 2 [IU] via SUBCUTANEOUS

## 2012-01-05 SURGICAL SUPPLY — 71 items
ACCESSORY FRAGMATOME (MISCELLANEOUS) ×2 IMPLANT
APL SRG 3 HI ABS STRL LF PLS (MISCELLANEOUS) ×6
APPLICATOR DR MATTHEWS STRL (MISCELLANEOUS) ×6 IMPLANT
BALL CTTN LRG ABS STRL LF (GAUZE/BANDAGES/DRESSINGS) ×6
BLADE EYE CATARACT 19 1.4 BEAV (BLADE) IMPLANT
BLADE KERATOME 2.75 (BLADE) ×3 IMPLANT
BLADE MVR KNIFE 20G (BLADE) ×2 IMPLANT
CANNULA FLEX TIP 25G (CANNULA) IMPLANT
CLOTH BEACON ORANGE TIMEOUT ST (SAFETY) ×3 IMPLANT
CORDS BIPOLAR (ELECTRODE) IMPLANT
COTTONBALL LRG STERILE PKG (GAUZE/BANDAGES/DRESSINGS) ×9 IMPLANT
COVER MAYO STAND STRL (DRAPES) IMPLANT
DRAPE INCISE 51X51 W/FILM STRL (DRAPES) IMPLANT
DRAPE OPHTHALMIC 77X100 STRL (CUSTOM PROCEDURE TRAY) ×3 IMPLANT
FILTER BLUE MILLIPORE (MISCELLANEOUS) IMPLANT
FORCEPS ECKARDT ILM 25G SERR (OPHTHALMIC RELATED) ×2 IMPLANT
FORCEPS HORIZONTAL 25G DISP (OPHTHALMIC RELATED) IMPLANT
GAS OPHTHALMIC (MISCELLANEOUS) IMPLANT
GLOVE SS BIOGEL STRL SZ 6.5 (GLOVE) ×3 IMPLANT
GLOVE SS BIOGEL STRL SZ 7 (GLOVE) ×2 IMPLANT
GLOVE SUPERSENSE BIOGEL SZ 6.5 (GLOVE) ×1
GLOVE SUPERSENSE BIOGEL SZ 7 (GLOVE) ×1
GLOVE SURG 8.5 LATEX PF (GLOVE) ×3 IMPLANT
GLOVE SURG SS PI 6.5 STRL IVOR (GLOVE) ×2 IMPLANT
GOWN STRL NON-REIN LRG LVL3 (GOWN DISPOSABLE) ×9 IMPLANT
ILLUMINATOR CHOW PICK 25GA (MISCELLANEOUS) ×3 IMPLANT
KIT BASIN OR (CUSTOM PROCEDURE TRAY) ×3 IMPLANT
KIT ROOM TURNOVER OR (KITS) ×3 IMPLANT
KNIFE CRESCENT 1.75 EDGEAHEAD (BLADE) ×2 IMPLANT
LENS IOL POST 1PIECE DIOP 20.0 (Intraocular Lens) ×2 IMPLANT
MARKER SKIN DUAL TIP RULER LAB (MISCELLANEOUS) IMPLANT
MICROPICK 25G (MISCELLANEOUS)
NDL 18GX1X1/2 (RX/OR ONLY) (NEEDLE) ×1 IMPLANT
NDL 25GX 5/8IN NON SAFETY (NEEDLE) ×1 IMPLANT
NDL FILTER BLUNT 18X1 1/2 (NEEDLE) ×1 IMPLANT
NDL HYPO 30X.5 LL (NEEDLE) ×1 IMPLANT
NEEDLE 18GX1X1/2 (RX/OR ONLY) (NEEDLE) ×3 IMPLANT
NEEDLE 25GX 5/8IN NON SAFETY (NEEDLE) ×3 IMPLANT
NEEDLE 27GAX1X1/2 (NEEDLE) IMPLANT
NEEDLE FILTER BLUNT 18X 1/2SAF (NEEDLE) ×1
NEEDLE FILTER BLUNT 18X1 1/2 (NEEDLE) ×2 IMPLANT
NEEDLE HYPO 30X.5 LL (NEEDLE) ×3 IMPLANT
NS IRRIG 1000ML POUR BTL (IV SOLUTION) ×3 IMPLANT
PACK VITRECTOMY CUSTOM (CUSTOM PROCEDURE TRAY) ×3 IMPLANT
PAD ARMBOARD 7.5X6 YLW CONV (MISCELLANEOUS) ×6 IMPLANT
PAD EYE OVAL STERILE LF (GAUZE/BANDAGES/DRESSINGS) ×2 IMPLANT
PAK VITRECTOMY PIK 25 GA (OPHTHALMIC RELATED) ×3 IMPLANT
PENCIL BIPOLAR 25GA STR DISP (OPHTHALMIC RELATED) IMPLANT
PICK MICROPICK 25G (MISCELLANEOUS) IMPLANT
PROBE DIRECTIONAL LASER (MISCELLANEOUS) ×2 IMPLANT
PROBE VITREOUS 25+ ACCURUS (MISCELLANEOUS) IMPLANT
ROLLS DENTAL (MISCELLANEOUS) ×6 IMPLANT
SCRAPER DIAMOND 25GA (OPHTHALMIC RELATED) IMPLANT
SPEAR EYE SURG WECK-CEL (MISCELLANEOUS) ×2 IMPLANT
SPONGE SURGIFOAM ABS GEL 12-7 (HEMOSTASIS) ×3 IMPLANT
STOPCOCK 4 WAY LG BORE MALE ST (IV SETS) IMPLANT
SUT CHROMIC 7 0 TG140 8 (SUTURE) ×2 IMPLANT
SUT ETHILON 10 0 CS140 6 (SUTURE) ×3 IMPLANT
SUT ETHILON 9 0 BV100 4 (SUTURE) IMPLANT
SUT POLY NON ABSORB 10-0 8 STR (SUTURE) ×6 IMPLANT
SUT SILK 4 0 RB 1 (SUTURE) IMPLANT
SYR 20CC LL (SYRINGE) ×3 IMPLANT
SYR 5ML LL (SYRINGE) IMPLANT
SYR BULB 3OZ (MISCELLANEOUS) ×3 IMPLANT
SYR TB 1ML LUER SLIP (SYRINGE) ×3 IMPLANT
SYRINGE 10CC LL (SYRINGE) IMPLANT
TAPE SURG TRANSPORE 1 IN (GAUZE/BANDAGES/DRESSINGS) ×1 IMPLANT
TAPE SURGICAL TRANSPORE 1 IN (GAUZE/BANDAGES/DRESSINGS)
TOWEL OR 17X24 6PK STRL BLUE (TOWEL DISPOSABLE) ×9 IMPLANT
TROCAR CANNULA 25GA (CANNULA) IMPLANT
WATER STERILE IRR 1000ML POUR (IV SOLUTION) ×3 IMPLANT

## 2012-01-05 NOTE — Preoperative (Signed)
Beta Blockers   Reason not to administer Beta Blockers:Not Applicable, took propanolol this am 

## 2012-01-05 NOTE — Anesthesia Preprocedure Evaluation (Addendum)
Anesthesia Evaluation  Patient identified by MRN, date of birth, ID band Patient awake    Reviewed: Allergy & Precautions, H&P , NPO status , Patient's Chart, lab work & pertinent test results  Airway Mallampati: I TM Distance: >3 FB Neck ROM: Full    Dental No notable dental hx. (+) Edentulous Upper and Edentulous Lower   Pulmonary neg pulmonary ROS,  breath sounds clear to auscultation  Pulmonary exam normal       Cardiovascular hypertension, On Medications and On Home Beta Blockers Rhythm:Regular Rate:Normal     Neuro/Psych negative neurological ROS  negative psych ROS   GI/Hepatic Neg liver ROS, GERD-  ,  Endo/Other  Diabetes mellitus-, Type 2, Oral Hypoglycemic Agents  Renal/GU negative Renal ROS  negative genitourinary   Musculoskeletal   Abdominal   Peds  Hematology negative hematology ROS (+)   Anesthesia Other Findings   Reproductive/Obstetrics negative OB ROS                          Anesthesia Physical Anesthesia Plan  ASA: III  Anesthesia Plan: General   Post-op Pain Management:    Induction: Intravenous  Airway Management Planned: Oral ETT  Additional Equipment:   Intra-op Plan:   Post-operative Plan: Extubation in OR  Informed Consent: I have reviewed the patients History and Physical, chart, labs and discussed the procedure including the risks, benefits and alternatives for the proposed anesthesia with the patient or authorized representative who has indicated his/her understanding and acceptance.     Plan Discussed with: CRNA  Anesthesia Plan Comments:         Anesthesia Quick Evaluation

## 2012-01-05 NOTE — Progress Notes (Signed)
Spoke with Dr. Sampson Goon .Marland Kitchen Regarding critical low (2.7) potassium for this patient... Dr states they will take care of this when he goes  To or.

## 2012-01-05 NOTE — Transfer of Care (Signed)
Immediate Anesthesia Transfer of Care Note  Patient: Tanner Owen  Procedure(s) Performed: Procedure(s) (LRB): GAS/FLUID EXCHANGE (Left) PARS PLANA VITRECTOMY WITH 25 GAUGE (Left) PLACEMENT AND SUTURE OF SECONDARY INTRAOCULAR LENS (Left) DIODE LASER APPLICATION (Left)  Patient Location: PACU  Anesthesia Type: General  Level of Consciousness: awake and alert   Airway & Oxygen Therapy: Patient Spontanous Breathing and Patient connected to nasal cannula oxygen  Post-op Assessment: Report given to PACU RN and Post -op Vital signs reviewed and stable  Post vital signs: Reviewed and stable  Complications: No apparent anesthesia complications

## 2012-01-05 NOTE — Op Note (Signed)
NAMEHUSAYN, Tanner Owen              ACCOUNT NO.:  [id number]  MEDICAL RECORD NO.:  [id number]  LOCATION:  [facility]                         FACILITY:  [facility]  PHYSICIAN:  Beulah Gandy. Ashley Royalty, M.D. DATE OF BIRTH:  02-12-1929  DATE OF PROCEDURE:  01/05/2012 DATE OF DISCHARGE:                              OPERATIVE REPORT   ADMISSION DIAGNOSIS:  Retained lens material and dislocated cataract into the vitreous, left eye.  PROCEDURES:  Pars plana vitrectomy, removal of dislocated cataract from vitreous, retinal photocoagulation, placement of secondary intraocular lens with suture and gas fluid exchange to the left eye and retinal photocoagulation to the left eye.  SURGEON:  Beulah Gandy. Ashley Royalty, M.D.  ASSISTANT:  Rosalie Doctor SA.  ANESTHESIA:  General.  DETAILS:  Usual prep and drape, 25-gauge trocars placed at 11, 3, and 5 o'clock.  Half thickness scleral flaps were raised at 4 o'clock, and 11, and 10 o'clock in anticipation of IOL suture.  The pars plana vitrectomy was begun in the pupillary axis where large white clumps of vitreous debris and lens.  Debris were encountered.  There was lens debris in the anterior chamber along with hyphema.  This was carefully removed under low suction and rapid cutting with a 25-gauge cutter.  The vitrectomy was carried posteriorly and large fluffy white material was seen in the vitreous cavity, and then a clamp lying on the macular surface.  The chop technique was used to remove the entire nucleus with the 25-gauge instrument.  Cortical material was also removed and the eye was rinsed extensively until all lens fragments were removed from the eye.  Some lens fragments were also removed from the posterior chamber and from around the area of capsular support.  Once the eye was totally cleaned the endolaser was positioned in the eye.  The weak area in the temporal macula was seen and then treated with laser and then the periphery was treated in several weak  areas.  The total was 380 burns, 1000 microns each, 1000 was the mW each, and 0.1 seconds each.  Attention was carried to the pars, plana, and the limbus where the two 10-0 Prolene sutures were passed beneath the flaps anterior to the capsular remnants and posterior to the iris from 4 o'clock to 11 o'clock beneath the flaps as well.  These sutures were externalized through the existing corneal wound from previous cataract surgery.  The new intraocular lens made by [employer] model CZ7OBD power 20.0 D, length 12.5 mm, optic 7.0 mm, serial #[id number], was brought onto the field inspected and cleaned, expiration date 08/17/2014.  The sutures were attached to the eyelets of the lens.  The lens was placed into the anterior chamber then into the posterior chamber and dialed into place.  Prolene sutures were knotted beneath the scleral flaps and trimmed.  Scleral flaps were allowed to cover the knots.  The corneal wound was closed with 5 interrupted 10-0 nylon sutures.  The conjunctiva was reposited with 7-0 chromic sutures.  The wounds were tested and found to be tight.  The corneal wound was.  The additional vitrectomy was carried out at this point removing some pigment and blood from the vitreous  cavity.  A 30% gas fluid exchange was performed.  The instruments and trocars were removed from the eye at this point.  The conjunctiva was closed with 7-0 chromic suture.  Polymyxin and gentamicin were irrigated into tenon space.  Marcaine was injected around the globe for postop pain. Decadron 10 mg was injected into the lower subconjunctival space.  Marcaine was injected around the globe for postop pain.  Closing pressure with 10, with a Baer care tonometer, Polysporin ophthalmic ointment, a patch and shield were placed.  The patient was awakened and taken to recovery in satisfactory condition.     Beulah Gandy. Ashley Royalty, M.D.     JDM/MEDQ  D:  01/05/2012  T:  01/05/2012  Job:   [id number]

## 2012-01-05 NOTE — Procedures (Signed)
Brief Operative note   Preoperative diagnosis:  Pre-Op Diagnosis Codes:    * Foreign body in vitreous [360.64] Postoperative diagnosis  Post-Op Diagnosis Codes:    * Foreign body in vitreous [360.64]  Procedures: Pars Plana Vitrectomy, laser treatment, removal of cataract from vitreous. Secondary IntraOcularLens placement with suture  Surgeon:  Sherrie George, MD...  Assistant:  Rosalie Doctor SA    Anesthesia: General  Specimen: none  Estimated blood loss:  1cc  Complications: none  Patient sent to PACU in good condition  Composed by Sherrie George MD  Dictation number: [phone]

## 2012-01-05 NOTE — Anesthesia Procedure Notes (Signed)
Procedure Name: Intubation Date/Time: 01/05/2012 3:12 PM Performed by: Margaree Mackintosh Pre-anesthesia Checklist: Patient identified, Timeout performed, Emergency Drugs available, Suction available and Patient being monitored Patient Re-evaluated:Patient Re-evaluated prior to inductionOxygen Delivery Method: Circle system utilized Preoxygenation: Pre-oxygenation with 100% oxygen Intubation Type: IV induction Ventilation: Mask ventilation without difficulty and Oral airway inserted - appropriate to patient size Laryngoscope Size: Mac and 4 Grade View: Grade I Tube type: Oral Tube size: 7.5 mm Number of attempts: 1 Airway Equipment and Method: Stylet and LTA kit utilized Placement Confirmation: ETT inserted through vocal cords under direct vision,  positive ETCO2 and breath sounds checked- equal and bilateral Secured at: 22 cm Tube secured with: Tape Dental Injury: Teeth and Oropharynx as per pre-operative assessment

## 2012-01-05 NOTE — Anesthesia Postprocedure Evaluation (Signed)
Anesthesia Post Note  Patient: Tanner Owen  Procedure(s) Performed: Procedure(s) (LRB): GAS/FLUID EXCHANGE (Left) PARS PLANA VITRECTOMY WITH 25 GAUGE (Left) PLACEMENT AND SUTURE OF SECONDARY INTRAOCULAR LENS (Left) DIODE LASER APPLICATION (Left)  Anesthesia type: General  Patient location: PACU  Post pain: Pain level controlled and Adequate analgesia  Post assessment: Post-op Vital signs reviewed, Patient's Cardiovascular Status Stable, Respiratory Function Stable, Patent Airway and Pain level controlled  Last Vitals:  Filed Vitals:   01/05/12 1057  BP: 153/62  Pulse: 46  Temp: 36.4 C  Resp: 18    Post vital signs: Reviewed and stable  Level of consciousness: awake, alert  and oriented  Complications: No apparent anesthesia complications

## 2012-01-05 NOTE — H&P (Signed)
I examined the patient today and there is no change in the medical status 

## 2012-01-06 ENCOUNTER — Inpatient Hospital Stay (HOSPITAL_COMMUNITY)
Admission: EM | Admit: 2012-01-06 | Discharge: 2012-01-08 | DRG: 988 | Disposition: A | Discharge status: Home Health | Payer: Medicare Other | Attending: Internal Medicine | Admitting: Internal Medicine

## 2012-01-06 ENCOUNTER — Emergency Department (HOSPITAL_COMMUNITY): Payer: Medicare Other

## 2012-01-06 ENCOUNTER — Encounter (HOSPITAL_COMMUNITY): Payer: Self-pay | Admitting: Ophthalmology

## 2012-01-06 ENCOUNTER — Other Ambulatory Visit (HOSPITAL_COMMUNITY): Payer: Self-pay | Admitting: Pharmacy Technician

## 2012-01-06 DIAGNOSIS — H59029 Cataract (lens) fragments in eye following cataract surgery, unspecified eye: Secondary | ICD-10-CM | POA: Diagnosis present

## 2012-01-06 DIAGNOSIS — E785 Hyperlipidemia, unspecified: Secondary | ICD-10-CM | POA: Diagnosis present

## 2012-01-06 DIAGNOSIS — Y838 Other surgical procedures as the cause of abnormal reaction of the patient, or of later complication, without mention of misadventure at the time of the procedure: Secondary | ICD-10-CM | POA: Diagnosis present

## 2012-01-06 DIAGNOSIS — E119 Type 2 diabetes mellitus without complications: Secondary | ICD-10-CM | POA: Diagnosis present

## 2012-01-06 DIAGNOSIS — N390 Urinary tract infection, site not specified: Secondary | ICD-10-CM | POA: Diagnosis present

## 2012-01-06 DIAGNOSIS — T502X5A Adverse effect of carbonic-anhydrase inhibitors, benzothiadiazides and other diuretics, initial encounter: Secondary | ICD-10-CM | POA: Diagnosis present

## 2012-01-06 DIAGNOSIS — H268 Other specified cataract: Secondary | ICD-10-CM | POA: Diagnosis present

## 2012-01-06 DIAGNOSIS — T8529XA Other mechanical complication of intraocular lens, initial encounter: Secondary | ICD-10-CM | POA: Diagnosis present

## 2012-01-06 DIAGNOSIS — K219 Gastro-esophageal reflux disease without esophagitis: Secondary | ICD-10-CM | POA: Diagnosis present

## 2012-01-06 DIAGNOSIS — I129 Hypertensive chronic kidney disease with stage 1 through stage 4 chronic kidney disease, or unspecified chronic kidney disease: Secondary | ICD-10-CM | POA: Diagnosis present

## 2012-01-06 DIAGNOSIS — I951 Orthostatic hypotension: Secondary | ICD-10-CM

## 2012-01-06 DIAGNOSIS — N189 Chronic kidney disease, unspecified: Secondary | ICD-10-CM | POA: Diagnosis present

## 2012-01-06 DIAGNOSIS — H2100 Hyphema, unspecified eye: Secondary | ICD-10-CM | POA: Diagnosis present

## 2012-01-06 DIAGNOSIS — Z79899 Other long term (current) drug therapy: Secondary | ICD-10-CM

## 2012-01-06 DIAGNOSIS — E876 Hypokalemia: Secondary | ICD-10-CM | POA: Diagnosis present

## 2012-01-06 DIAGNOSIS — R55 Syncope and collapse: Secondary | ICD-10-CM

## 2012-01-06 DIAGNOSIS — N179 Acute kidney failure, unspecified: Secondary | ICD-10-CM

## 2012-01-06 DIAGNOSIS — D72829 Elevated white blood cell count, unspecified: Secondary | ICD-10-CM | POA: Diagnosis present

## 2012-01-06 DIAGNOSIS — E871 Hypo-osmolality and hyponatremia: Secondary | ICD-10-CM

## 2012-01-06 DIAGNOSIS — K222 Esophageal obstruction: Secondary | ICD-10-CM

## 2012-01-06 DIAGNOSIS — Z7982 Long term (current) use of aspirin: Secondary | ICD-10-CM

## 2012-01-06 LAB — CARDIAC PANEL(CRET KIN+CKTOT+MB+TROPI)
CK, MB: 7 ng/mL (ref 0.3–4.0)
Total CK: 256 U/L — ABNORMAL HIGH (ref 7–232)
Troponin I: 0.34 ng/mL (ref <0.30)

## 2012-01-06 LAB — COMPREHENSIVE METABOLIC PANEL
ALT: 18 U/L (ref 0–53)
AST: 22 U/L (ref 0–37)
Alkaline Phosphatase: 36 U/L — ABNORMAL LOW (ref 39–117)
CO2: 20 mEq/L (ref 19–32)
Calcium: 8.8 mg/dL (ref 8.4–10.5)
GFR calc Af Amer: 32 mL/min — ABNORMAL LOW (ref >90)
GFR calc non Af Amer: 28 mL/min — ABNORMAL LOW (ref >90)
Glucose, Bld: 152 mg/dL — ABNORMAL HIGH (ref 70–99)
Potassium: 2.7 mEq/L — CL (ref 3.5–5.1)
Sodium: 127 mEq/L — ABNORMAL LOW (ref 135–145)

## 2012-01-06 LAB — BASIC METABOLIC PANEL
BUN: 40 mg/dL — ABNORMAL HIGH (ref 6–23)
CO2: 22 mEq/L (ref 19–32)
Chloride: 92 mEq/L — ABNORMAL LOW (ref 96–112)
GFR calc non Af Amer: 30 mL/min — ABNORMAL LOW (ref >90)
Glucose, Bld: 122 mg/dL — ABNORMAL HIGH (ref 70–99)
Potassium: 2.8 mEq/L — ABNORMAL LOW (ref 3.5–5.1)
Sodium: 128 mEq/L — ABNORMAL LOW (ref 135–145)

## 2012-01-06 LAB — DIFFERENTIAL
Basophils Relative: 0 % (ref 0–1)
Eosinophils Absolute: 0 10*3/uL (ref 0.0–0.7)
Eosinophils Relative: 0 % (ref 0–5)
Lymphocytes Relative: 6 % — ABNORMAL LOW (ref 12–46)
Monocytes Relative: 13 % — ABNORMAL HIGH (ref 3–12)
Neutro Abs: 13.9 10*3/uL — ABNORMAL HIGH (ref 1.7–7.7)
Neutrophils Relative %: 81 % — ABNORMAL HIGH (ref 43–77)

## 2012-01-06 LAB — URINALYSIS, ROUTINE W REFLEX MICROSCOPIC
Bilirubin Urine: NEGATIVE
Specific Gravity, Urine: 1.017 (ref 1.005–1.030)
Urobilinogen, UA: 1 mg/dL (ref 0.0–1.0)
pH: 6 (ref 5.0–8.0)

## 2012-01-06 LAB — POCT I-STAT TROPONIN I

## 2012-01-06 LAB — CBC
HCT: 39.1 % (ref 39.0–52.0)
Hemoglobin: 14.1 g/dL (ref 13.0–17.0)
RBC: 4.69 MIL/uL (ref 4.22–5.81)
WBC: 17.1 10*3/uL — ABNORMAL HIGH (ref 4.0–10.5)

## 2012-01-06 LAB — PRO B NATRIURETIC PEPTIDE: Pro B Natriuretic peptide (BNP): 1415 pg/mL — ABNORMAL HIGH (ref 0–450)

## 2012-01-06 LAB — URINE MICROSCOPIC-ADD ON

## 2012-01-06 LAB — GLUCOSE, CAPILLARY

## 2012-01-06 LAB — PHOSPHORUS: Phosphorus: 3.7 mg/dL (ref 2.3–4.6)

## 2012-01-06 MED ORDER — SODIUM CHLORIDE 0.9 % IV SOLN
1000.0000 mL | Freq: Once | INTRAVENOUS | Status: AC
  Administered 2012-01-06: 1000 mL via INTRAVENOUS

## 2012-01-06 MED ORDER — ACETAZOLAMIDE 250 MG PO TABS
250.0000 mg | ORAL_TABLET | Freq: Three times a day (TID) | ORAL | Status: DC
  Administered 2012-01-06 – 2012-01-08 (×6): 250 mg via ORAL
  Filled 2012-01-06 (×7): qty 1

## 2012-01-06 MED ORDER — LINAGLIPTIN 5 MG PO TABS
5.0000 mg | ORAL_TABLET | Freq: Every day | ORAL | Status: DC
  Administered 2012-01-07 – 2012-01-08 (×2): 5 mg via ORAL
  Filled 2012-01-06 (×2): qty 1

## 2012-01-06 MED ORDER — SODIUM CHLORIDE 0.9 % IV SOLN
INTRAVENOUS | Status: DC
  Administered 2012-01-06 – 2012-01-08 (×3): via INTRAVENOUS

## 2012-01-06 MED ORDER — HYDROCODONE-ACETAMINOPHEN 5-500 MG PO TABS: 1.0000 | ORAL_TABLET | Freq: Four times a day (QID) | ORAL | Status: AC | PRN

## 2012-01-06 MED ORDER — SODIUM CHLORIDE 0.9 % IJ SOLN
3.0000 mL | Freq: Two times a day (BID) | INTRAMUSCULAR | Status: DC
  Administered 2012-01-06 – 2012-01-08 (×3): 3 mL via INTRAVENOUS

## 2012-01-06 MED ORDER — DEXTROSE 5 % IV SOLN
1.0000 g | INTRAVENOUS | Status: DC
  Administered 2012-01-06 – 2012-01-07 (×2): 1 g via INTRAVENOUS
  Filled 2012-01-06 (×3): qty 10

## 2012-01-06 MED ORDER — SODIUM CHLORIDE 0.9 % IV SOLN: 1000.0000 mL | INTRAVENOUS | Status: DC

## 2012-01-06 MED ORDER — BACITRACIN-POLYMYXIN B 500-10000 UNIT/GM OP OINT: 1.0000 "application " | TOPICAL_OINTMENT | Freq: Four times a day (QID) | OPHTHALMIC | Status: AC

## 2012-01-06 MED ORDER — ONDANSETRON HCL 4 MG/2ML IJ SOLN: 4.0000 mg | Freq: Four times a day (QID) | INTRAMUSCULAR | Status: DC | PRN

## 2012-01-06 MED ORDER — TRAVOPROST (BAK FREE) 0.004 % OP SOLN
1.0000 [drp] | Freq: Every day | OPHTHALMIC | Status: DC
  Administered 2012-01-07 (×2): 1 [drp] via OPHTHALMIC
  Filled 2012-01-06: qty 2.5

## 2012-01-06 MED ORDER — HYDROCODONE-ACETAMINOPHEN 5-325 MG PO TABS
1.0000 | ORAL_TABLET | Freq: Four times a day (QID) | ORAL | Status: DC | PRN
  Administered 2012-01-07: 1 via ORAL
  Filled 2012-01-06: qty 2

## 2012-01-06 MED ORDER — ASPIRIN EC 81 MG PO TBEC
81.0000 mg | DELAYED_RELEASE_TABLET | Freq: Every day | ORAL | Status: DC
  Administered 2012-01-07 – 2012-01-08 (×2): 81 mg via ORAL
  Filled 2012-01-06 (×2): qty 1

## 2012-01-06 MED ORDER — ONDANSETRON HCL 4 MG PO TABS: 4.0000 mg | ORAL_TABLET | Freq: Four times a day (QID) | ORAL | Status: DC | PRN

## 2012-01-06 MED ORDER — VITAMIN D3 25 MCG (1000 UNIT) PO TABS
1000.0000 [IU] | ORAL_TABLET | Freq: Every day | ORAL | Status: DC
  Administered 2012-01-07 – 2012-01-08 (×2): 1000 [IU] via ORAL
  Filled 2012-01-06 (×2): qty 1

## 2012-01-06 MED ORDER — POTASSIUM CHLORIDE CRYS ER 20 MEQ PO TBCR
40.0000 meq | EXTENDED_RELEASE_TABLET | Freq: Once | ORAL | Status: AC
  Administered 2012-01-06: 40 meq via ORAL
  Filled 2012-01-06: qty 2

## 2012-01-06 MED ORDER — PREDNISOLONE ACETATE 1 % OP SUSP: 1.0000 [drp] | Freq: Four times a day (QID) | OPHTHALMIC | Status: DC

## 2012-01-06 MED ORDER — SODIUM CHLORIDE 0.9 % IV BOLUS (SEPSIS)
500.0000 mL | Freq: Once | INTRAVENOUS | Status: AC
  Administered 2012-01-06: 500 mL via INTRAVENOUS

## 2012-01-06 MED ORDER — ATORVASTATIN CALCIUM 20 MG PO TABS
20.0000 mg | ORAL_TABLET | Freq: Every day | ORAL | Status: DC
  Administered 2012-01-07: 20 mg via ORAL
  Filled 2012-01-06 (×2): qty 1

## 2012-01-06 MED ORDER — BRIMONIDINE TARTRATE 0.2 % OP SOLN: 1.0000 [drp] | Freq: Two times a day (BID) | OPHTHALMIC | Status: DC

## 2012-01-06 MED ORDER — POTASSIUM CHLORIDE 10 MEQ/100ML IV SOLN
10.0000 meq | Freq: Once | INTRAVENOUS | Status: AC
  Administered 2012-01-06: 10 meq via INTRAVENOUS
  Filled 2012-01-06: qty 100

## 2012-01-06 MED ORDER — PROPRANOLOL HCL 40 MG PO TABS
80.0000 mg | ORAL_TABLET | Freq: Two times a day (BID) | ORAL | Status: DC
  Administered 2012-01-06 – 2012-01-08 (×4): 80 mg via ORAL
  Filled 2012-01-06: qty 1
  Filled 2012-01-06 (×4): qty 2

## 2012-01-06 MED ORDER — GATIFLOXACIN 0.5 % OP SOLN: 1.0000 [drp] | Freq: Four times a day (QID) | OPHTHALMIC | Status: DC

## 2012-01-06 MED ORDER — POTASSIUM CHLORIDE CRYS ER 20 MEQ PO TBCR
40.0000 meq | EXTENDED_RELEASE_TABLET | Freq: Every day | ORAL | Status: DC
  Administered 2012-01-06 – 2012-01-08 (×3): 40 meq via ORAL
  Filled 2012-01-06 (×3): qty 2

## 2012-01-06 MED ORDER — GATIFLOXACIN 0.5 % OP SOLN
1.0000 [drp] | Freq: Four times a day (QID) | OPHTHALMIC | Status: DC
  Administered 2012-01-06 – 2012-01-08 (×7): 1 [drp] via OPHTHALMIC
  Filled 2012-01-06: qty 2.5

## 2012-01-06 MED ORDER — BACITRACIN-POLYMYXIN B 500-10000 UNIT/GM OP OINT
1.0000 "application " | TOPICAL_OINTMENT | Freq: Four times a day (QID) | OPHTHALMIC | Status: DC
  Administered 2012-01-07 – 2012-01-08 (×6): 1 via OPHTHALMIC
  Filled 2012-01-06: qty 3.5

## 2012-01-06 MED ORDER — PANTOPRAZOLE SODIUM 40 MG PO TBEC
40.0000 mg | DELAYED_RELEASE_TABLET | Freq: Every day | ORAL | Status: DC
  Administered 2012-01-07 – 2012-01-08 (×2): 40 mg via ORAL
  Filled 2012-01-06 (×2): qty 1

## 2012-01-06 MED ORDER — POTASSIUM CHLORIDE CRYS ER 20 MEQ PO TBCR
20.0000 meq | EXTENDED_RELEASE_TABLET | Freq: Once | ORAL | Status: AC
  Administered 2012-01-06: 20 meq via ORAL
  Filled 2012-01-06: qty 1

## 2012-01-06 NOTE — ED Notes (Signed)
Pt states he had  Left eye surgery yesterday.

## 2012-01-06 NOTE — H&P (Signed)
PCP:  No primary provider on file.   DOA:  01/06/2012  6:55 PM  Chief Complaint:  Syncope  HPI: Pt is 78 yo male who presents to [facility] ED with main concern of an episode of "passing out" today, the day of the admission. He reports LOC for 2-3 minutes and son got him down to the floor where he regained consciousness. Pt denies any significant events prior to the episode and has not been doing any strenuous activity. He was eating and felt as he was chocking on the food. He denies chest pain or shortness of breath, no specific aggravating or alleviating factors, no similar episodes in the past. Pt had eye surgery yesterday and was released today. Pt denies specific focal weakness, no headaches, no specific abdominal concerns, no specific urinary concerns. He is feeling fine now.   Allergies: No Known Allergies  Prior to Admission medications   Medication Sig Start Date End Date Taking? Authorizing Provider  acetaZOLAMIDE (DIAMOX) 250 MG tablet Take 250 mg by mouth 4 times daily. 01/01/12  Yes Historical Provider, MD  aspirin EC 81 MG tablet Take 81 mg by mouth daily.   Yes Historical Provider, MD  bacitracin-polymyxin b (POLYSPORIN) ophthalmic ointment Place 1 application into the left eye 4 (four) times daily. apply to eye every 12 hours while awake 01/06/12 01/16/12 Yes Sherrie George, MD  cholecalciferol (VITAMIN D) 1000 UNITS tablet Take 1,000 Units by mouth daily.   Yes Historical Provider, MD  fish oil-omega-3 fatty acids 1000 MG capsule Take 1 g by mouth daily.   Yes Historical Provider, MD  gatifloxacin (ZYMAXID) 0.5 % SOLN Place 1 drop into the left eye 4 (four) times daily. 01/06/12  Yes Sherrie George, MD  hydrochlorothiazide (HYDRODIURIL) 25 MG tablet Take 25 mg by mouth daily.  10/22/11  Yes Historical Provider, MD  HYDROcodone-acetaminophen (VICODIN) 5-500 MG per tablet Take 1 tablet by mouth every 6 (six) hours as needed for pain. 01/06/12 01/16/12 Yes Sherrie George, MD  JANUVIA 100 MG  tablet Take 100 mg by mouth daily.  10/28/11  Yes Historical Provider, MD  lisinopril (PRINIVIL,ZESTRIL) 5 MG tablet Take 5 mg by mouth daily.  11/27/11  Yes Historical Provider, MD  omeprazole (PRILOSEC) 20 MG capsule Take 20 mg by mouth daily.   Yes Historical Provider, MD  propranolol (INDERAL) 80 MG tablet Take 80 mg by mouth 2 (two) times daily.  12/26/11  Yes Historical Provider, MD  rosuvastatin (CRESTOR) 5 MG tablet Take 5 mg by mouth daily.   Yes Historical Provider, MD  TRAVATAN Z 0.004 % SOLN ophthalmic solution Place 1 drop into both eyes at bedtime.  12/03/11  Yes Historical Provider, MD    Past Medical History  Diagnosis Date  . Diabetes mellitus   . Hypertension   . Hyperlipidemia   . GERD (gastroesophageal reflux disease)     Past Surgical History  Procedure Date  . Hernia repair     hernia surgery x2 approx.30 years ago  . Eye surgery     caratarct surgery on 11/29/11, left eye  . Gas/fluid exchange 01/05/2012    Procedure: GAS/FLUID EXCHANGE;  Surgeon: Sherrie George, MD;  Location: [facility] OR;  Service: Ophthalmology;  Laterality: Left;  . Pars plana vitrectomy 01/05/2012    Procedure: PARS PLANA VITRECTOMY WITH 25 GAUGE;  Surgeon: Sherrie George, MD;  Location[facility] OR;  Service: Ophthalmology;  Laterality: Left;  . Placement and suture of secondary intraocular lens 01/05/2012  Procedure: PLACEMENT AND SUTURE OF SECONDARY INTRAOCULAR LENS;  Surgeon: Sherrie George, MD;  Location: [facility] OR;  Service: Ophthalmology;  Laterality: Left;  . Esophageal dilation     Social History:  reports that he quit smoking about 23 years ago. His smoking use included Cigarettes. He has a 12.5 pack-year smoking history. He has never used smokeless tobacco. He reports that he does not drink alcohol or use illicit drugs.  History reviewed. No pertinent family history.  Review of Systems:  Constitutional: Denies fever, chills, diaphoresis, appetite change and fatigue.  HEENT: Denies  photophobia, eye pain, redness, hearing loss, ear pain, congestion, sore throat, rhinorrhea, sneezing, mouth sores, trouble swallowing, neck pain, neck stiffness and tinnitus.   Respiratory: Denies SOB, DOE, cough, chest tightness,  and wheezing.   Cardiovascular: Denies chest pain, palpitations and leg swelling.  Gastrointestinal: Denies nausea, vomiting, abdominal pain, diarrhea, constipation, blood in stool and abdominal distention.  Genitourinary: Denies dysuria, urgency, frequency, hematuria, flank pain and difficulty urinating.  Musculoskeletal: Denies myalgias, back pain, joint swelling, arthralgias and gait problem.  Skin: Denies pallor, rash and wound.  Neurological: Denies seizures, weakness, light-headedness, numbness and headaches.  Hematological: Denies adenopathy. Easy bruising, personal or family bleeding history  Psychiatric/Behavioral: Denies suicidal ideation, mood changes, confusion, nervousness, sleep disturbance and agitation  Physical Exam:  Filed Vitals:   01/06/12 1942  BP: 116/60  Pulse: 68  Temp: 97.8 F (36.6 C)  TempSrc: Oral  Resp: 22  SpO2: 95%    Constitutional: Vital signs reviewed.  Patient is in no acute distress and cooperative with exam. Alert and oriented x3.  Head: Normocephalic and atraumatic Ear: TM normal bilaterally Mouth: no erythema or exudates, MMM Eyes: PERRL, EOMI, conjunctivae normal, No scleral icterus.  Neck: Supple, Trachea midline normal ROM, No JVD, mass, thyromegaly, or carotid bruit present.  Cardiovascular: RRR, S1 normal, S2 normal, no MRG, pulses symmetric and intact bilaterally Pulmonary/Chest: CTAB, no wheezes, rales, or rhonchi Abdominal: Soft. Non-tender, non-distended, bowel sounds are normal, no masses, organomegaly, or guarding present.  GU: no CVA tenderness Musculoskeletal: No joint deformities, erythema, or stiffness, ROM full and no nontender Ext: no edema and no cyanosis, pulses palpable bilaterally (DP and  PT) Hematology: no cervical, inginal, or axillary adenopathy.  Neurological: A&O x3, Strenght is normal and symmetric bilaterally, cranial nerve II-XII are grossly intact, no focal motor deficit, sensory intact to light touch bilaterally.  Skin: Warm, dry and intact. No rash, cyanosis, or clubbing.  Psychiatric: Normal mood and affect. speech and behavior is normal. Judgment and thought content normal. Cognition and memory are normal.   Labs on Admission:  Results for orders placed during the hospital encounter of 01/06/12 (from the past 48 hour(s))  URINALYSIS, ROUTINE W REFLEX MICROSCOPIC     Status: Abnormal   Collection Time   01/06/12  8:07 PM      Component Value Range Comment   Color, Urine YELLOW  YELLOW     APPearance CLOUDY (*) CLEAR     Specific Gravity, Urine 1.017  1.005 - 1.030     pH 6.0  5.0 - 8.0     Glucose, UA NEGATIVE  NEGATIVE (mg/dL)    Hgb urine dipstick LARGE (*) NEGATIVE     Bilirubin Urine NEGATIVE  NEGATIVE     Ketones, ur TRACE (*) NEGATIVE (mg/dL)    Protein, ur 161 (*) NEGATIVE (mg/dL)    Urobilinogen, UA 1.0  0.0 - 1.0 (mg/dL)    Nitrite NEGATIVE  NEGATIVE  Leukocytes, UA MODERATE (*) NEGATIVE    URINE MICROSCOPIC-ADD ON     Status: Abnormal   Collection Time   01/06/12  8:07 PM      Component Value Range Comment   Squamous Epithelial / LPF FEW (*) RARE     WBC, UA 21-50  <3 (WBC/hpf) WBC CLUMPS   RBC / HPF 21-50  <3 (RBC/hpf)    Bacteria, UA MANY (*) RARE     Casts HYALINE CASTS (*) NEGATIVE     Urine-Other MUCOUS PRESENT     CBC     Status: Abnormal   Collection Time   01/06/12  8:36 PM      Component Value Range Comment   WBC 17.1 (*) 4.0 - 10.5 (K/uL)    RBC 4.69  4.22 - 5.81 (MIL/uL)    Hemoglobin 14.1  13.0 - 17.0 (g/dL)    HCT 16.1  09.6 - 04.5 (%)    MCV 83.4  78.0 - 100.0 (fL)    MCH 30.1  26.0 - 34.0 (pg)    MCHC 36.1 (*) 30.0 - 36.0 (g/dL)    RDW 40.9 (*) 81.1 - 15.5 (%)    Platelets 131 (*) 150 - 400 (K/uL)   DIFFERENTIAL      Status: Abnormal   Collection Time   01/06/12  8:36 PM      Component Value Range Comment   Neutrophils Relative 81 (*) 43 - 77 (%)    Lymphocytes Relative 6 (*) 12 - 46 (%)    Monocytes Relative 13 (*) 3 - 12 (%)    Eosinophils Relative 0  0 - 5 (%)    Basophils Relative 0  0 - 1 (%)    Neutro Abs 13.9 (*) 1.7 - 7.7 (K/uL)    Lymphs Abs 1.0  0.7 - 4.0 (K/uL)    Monocytes Absolute 2.2 (*) 0.1 - 1.0 (K/uL)    Eosinophils Absolute 0.0  0.0 - 0.7 (K/uL)    Basophils Absolute 0.0  0.0 - 0.1 (K/uL)    WBC Morphology TOXIC GRANULATION   DOHLE BODIES   Smear Review LARGE PLATELETS PRESENT     COMPREHENSIVE METABOLIC PANEL     Status: Abnormal   Collection Time   01/06/12  8:36 PM      Component Value Range Comment   Sodium 127 (*) 135 - 145 (mEq/L)    Potassium 2.7 (*) 3.5 - 5.1 (mEq/L)    Chloride 94 (*) 96 - 112 (mEq/L)    CO2 20  19 - 32 (mEq/L)    Glucose, Bld 152 (*) 70 - 99 (mg/dL)    BUN 44 (*) 6 - 23 (mg/dL)    Creatinine, Ser 9.14 (*) 0.50 - 1.35 (mg/dL)    Calcium 8.8  8.4 - 10.5 (mg/dL)    Total Protein 6.2  6.0 - 8.3 (g/dL)    Albumin 3.7  3.5 - 5.2 (g/dL)    AST 22  0 - 37 (U/L)    ALT 18  0 - 53 (U/L)    Alkaline Phosphatase 36 (*) 39 - 117 (U/L)    Total Bilirubin 1.8 (*) 0.3 - 1.2 (mg/dL)    GFR calc non Af Amer 28 (*) >90 (mL/min)    GFR calc Af Amer 32 (*) >90 (mL/min)   POCT I-STAT TROPONIN I     Status: Abnormal   Collection Time   01/06/12  8:39 PM      Component Value Range Comment   Troponin i, poc  0.12 (*) 0.00 - 0.08 (ng/mL)    Comment NOTIFIED PHYSICIAN      Comment 3              Radiological Exams on Admission:  CXR: 01/06/2012 No acute findings  CT HEAD: 01/06/2012 IMPRESSION:  1. No acute intracranial abnormality.  2. Moderate generalized atrophy white matter disease, likely reflecting the sequelae of chronic microvascular ischemia.  3. Remote lacunar infarcts of the cerebellum bilaterally.  4. Postoperative changes of the left  globe.  Assessment/Plan  Syncopal event - unclear etiology at this time, ? Dehydration vs neurocardiogenic event, ? Orthostatic hypotension - CT head unremarkable fr acute events, but pt has degree of hyponatremia with acute on chronic renal failure suggestive of dehydration - will admit the pt to telemetry nit for further evaluation and management - will check orthostatic vitals, 12 lead EKG, CE"s, TSH - will also check FLP, A1C - provide supportive care with IVF, antiemetics and analgesia as needed for adequate pain control - PT evaluation  UTI - pt has no specific symptoms but has leukocytosis with pre- renal etiology of acute on chronic renal failure - will hydrate with NS and will continue Rocephin for now - monitor vitals per floor protocol  Leukocytosis - secondary to UTI likely - CXR unremarkable for acute event - CBC in AM  Acute on chronic renal failure - now worsening GFR and Cr from pt's baseline - continue IVF, hold nephrotoxic agents - BMP in AM  Hyponatremia - pre renal  - hydrate  Hypokalemia - KCL 6 runs given in ED - supplement as indicated - check 12 lead EKG - check Mg level  DM2 - check A1C - continue home medication regimen  HTN - hold lisinopril and HCTZ given worsening renal failure - continue Propranolol  HLD - continue Lipitor - check FLP  DVT Prophylaxis - SCD's  Code Status - Full  Education  - test results and diagnostic studies were discussed with patient  - patient verbalized the understanding - questions were answered at the bedside and contact information was provided for additional questions or concerns  Time Spent on Admission: Over 30 minutes  MAGICK-Rashaun Wichert 01/06/2012, 9:45 PM  [facility] Pager # [phone] Main Office # [phone]

## 2012-01-06 NOTE — ED Notes (Signed)
Pt. I stat troponin  labs shown to Dr Linwood Dibbles.

## 2012-01-06 NOTE — Progress Notes (Signed)
Patient attempt to void multiple times. No success. Bladder scan results 689 cc. Pateint's potassium level this a.m 2.8. Dr. Ashley Royalty notified of above. Orders given. One dose of KCL now. And insert indwelling foley catheter. Instruct pt on care of foley and to call urology office and schedule follow-up appointment.

## 2012-01-06 NOTE — ED Notes (Signed)
ZOX:[id number]<AV> Expected date:<BR> Expected time:<BR> Means of arrival:<BR> Comments:<BR> M15

## 2012-01-06 NOTE — ED Provider Notes (Signed)
History     CSN: [id number]  Arrival date & time 01/06/12  Paulo Fruit   First MD Initiated Contact with Patient 01/06/12 1901      Chief Complaint  Patient presents with  . Loss of Consciousness  . Choking    HPI Pt was eating at the dinner table when he had a brief loss of consciousness.  Son noticed his head bobbing down.  He had LOC for 2-3 minutes.  Son had got him down to the floor where he regained consciousness.  Pt did not cough anything up in particular.  He does not think he choked on the food.  Pt had eye surgery yesterday and was released today.  It was complicated by urinary retention.   He had not eaten much all day.  No chest pain or shortness.    No headache.  No weakness.  No abdominal pain or speech problem.    No prior history of syncope.  Pt states he is feeling fine now.  Past Medical History  Diagnosis Date  . Diabetes mellitus   . Hypertension   . Hyperlipidemia   . GERD (gastroesophageal reflux disease)     Past Surgical History  Procedure Date  . Hernia repair     hernia surgery x2 approx.30 years ago  . Eye surgery     caratarct surgery on 11/29/11, left eye  . Gas/fluid exchange 01/05/2012    Procedure: GAS/FLUID EXCHANGE;  Surgeon: Sherrie George, MD;  Location[facility] OR;  Service: Ophthalmology;  Laterality: Left;  . Pars plana vitrectomy 01/05/2012    Procedure: PARS PLANA VITRECTOMY WITH 25 GAUGE;  Surgeon: Sherrie George, MD;  Location: [facility] OR;  Service: Ophthalmology;  Laterality: Left;  . Placement and suture of secondary intraocular lens 01/05/2012    Procedure: PLACEMENT AND SUTURE OF SECONDARY INTRAOCULAR LENS;  Surgeon: Sherrie George, MD;  Location: [facility] OR;  Service: Ophthalmology;  Laterality: Left;  . Esophageal dilation     No family history on file.  History  Substance Use Topics  . Smoking status: Former Smoker    Types: Cigarettes  . Smokeless tobacco: Not on file   Comment: stopped smoking in 1990  . Alcohol Use: No      Review of  Systems  All other systems reviewed and are negative.    Allergies  Review of patient's allergies indicates no known allergies.  Home Medications   Current Outpatient Rx  Name Route Sig Dispense Refill  . ACETAZOLAMIDE 250 MG PO TABS Oral Take 250 mg by mouth 4 times daily.    . ASPIRIN EC 81 MG PO TBEC Oral Take 81 mg by mouth daily.    Marland Kitchen BACITRACIN-POLYMYXIN B 500-10000 UNIT/GM OP OINT Left Eye Place 1 application into the left eye 4 (four) times daily. apply to eye every 12 hours while awake 3.5 g   . BRIMONIDINE TARTRATE 0.2 % OP SOLN Ophthalmic Apply 1 drop to eye 3 (three) times daily.    Marland Kitchen BRIMONIDINE TARTRATE 0.2 % OP SOLN Left Eye Place 1 drop into the left eye 2 (two) times daily. 5 mL   . VITAMIN D 1000 UNITS PO TABS Oral Take 1,000 Units by mouth daily.    . DORZOLAMIDE HCL-TIMOLOL MAL 22.3-6.8 MG/ML OP SOLN Ophthalmic Apply 1 drop to eye 2 (two) times daily.    . OMEGA-3 FATTY ACIDS 1000 MG PO CAPS Oral Take 1 g by mouth daily.    Marland Kitchen GATIFLOXACIN 0.5 % OP SOLN Left  Eye Place 1 drop into the left eye 4 (four) times daily.    Marland Kitchen HYDROCHLOROTHIAZIDE 25 MG PO TABS Oral Take 25 mg by mouth daily.     Marland Kitchen HYDROCODONE-ACETAMINOPHEN 5-500 MG PO TABS Oral Take 1 tablet by mouth every 6 (six) hours as needed for pain. 30 tablet 0  . JANUVIA 100 MG PO TABS Oral Take 100 mg by mouth daily.     Marland Kitchen LISINOPRIL 5 MG PO TABS Oral Take 5 mg by mouth daily.     Marland Kitchen OMEPRAZOLE 20 MG PO CPDR Oral Take 20 mg by mouth daily.    Marland Kitchen PREDNISOLONE ACETATE 1 % OP SUSP Ophthalmic Apply 1 drop to eye 4 (four) times daily.    Marland Kitchen PREDNISOLONE ACETATE 1 % OP SUSP Left Eye Place 1 drop into the left eye 4 (four) times daily. 5 mL   . PROPRANOLOL HCL 80 MG PO TABS Oral Take 80 mg by mouth 2 (two) times daily.     Marland Kitchen ROSUVASTATIN CALCIUM 5 MG PO TABS Oral Take 5 mg by mouth daily.    . TRAVATAN Z 0.004 % OP SOLN Both Eyes Place 1 drop into both eyes at bedtime.       There were no vitals taken for this  visit.  Physical Exam  Nursing note and vitals reviewed. Constitutional: He appears well-developed and well-nourished. No distress.  HENT:  Head: Normocephalic and atraumatic.  Right Ear: External ear normal.  Left Ear: External ear normal.  Eyes: Right eye exhibits no discharge. Left eye exhibits discharge. Left conjunctiva is injected. No scleral icterus.  Neck: Neck supple. No tracheal deviation present.  Cardiovascular: Normal rate, regular rhythm and intact distal pulses.   Pulmonary/Chest: Effort normal and breath sounds normal. No stridor. No respiratory distress. He has no wheezes. He has no rales.  Abdominal: Soft. Bowel sounds are normal. He exhibits no distension. There is no tenderness. There is no rebound and no guarding.  Musculoskeletal: He exhibits no edema and no tenderness.  Neurological: He is alert. He has normal strength. No sensory deficit. Cranial nerve deficit:  no gross defecits noted. He exhibits normal muscle tone. He displays no seizure activity. Coordination normal.  Skin: Skin is warm and dry. No rash noted.  Psychiatric: He has a normal mood and affect.    ED Course  Procedures (including critical care time)  Rate: 68  Rhythm: normal sinus rhythm  QRS Axis: Left  Intervals: normal  ST/T Wave abnormalities: normal  Conduction Disutrbances: Left anterior fascicular block  Narrative Interpretation: Borderline and repolarization abnormality  Old EKG Reviewed: No significant change   CRITICAL CARE Performed by: Linwood Dibbles R   Total critical care time: 35 Critical care time was exclusive of separately billable procedures and treating other patients.  Critical care was necessary to treat or prevent imminent or life-threatening deterioration.  Critical care was time spent personally by me on the following activities: development of treatment plan with patient and/or surrogate as well as nursing, discussions with consultants, evaluation of patient's  response to treatment, examination of patient, obtaining history from patient or surrogate, ordering and performing treatments and interventions, ordering and review of laboratory studies, ordering and review of radiographic studies, pulse oximetry and re-evaluation of patient's condition.  Labs Reviewed  CBC - Abnormal; Notable for the following:    WBC 17.1 (*)    MCHC 36.1 (*)    RDW 15.8 (*)    Platelets 131 (*)    All other components within  normal limits  DIFFERENTIAL - Abnormal; Notable for the following:    Neutrophils Relative 81 (*)    Lymphocytes Relative 6 (*)    Monocytes Relative 13 (*)    Neutro Abs 13.9 (*)    Monocytes Absolute 2.2 (*)    All other components within normal limits  COMPREHENSIVE METABOLIC PANEL - Abnormal; Notable for the following:    Sodium 127 (*)    Potassium 2.7 (*)    Chloride 94 (*)    Glucose, Bld 152 (*)    BUN 44 (*)    Creatinine, Ser 2.13 (*)    Alkaline Phosphatase 36 (*)    Total Bilirubin 1.8 (*)    GFR calc non Af Amer 28 (*)    GFR calc Af Amer 32 (*)    All other components within normal limits  URINALYSIS, ROUTINE W REFLEX MICROSCOPIC - Abnormal; Notable for the following:    APPearance CLOUDY (*)    Hgb urine dipstick LARGE (*)    Ketones, ur TRACE (*)    Protein, ur 100 (*)    Leukocytes, UA MODERATE (*)    All other components within normal limits  POCT I-STAT TROPONIN I - Abnormal; Notable for the following:    Troponin i, poc 0.12 (*)    All other components within normal limits  URINE MICROSCOPIC-ADD ON - Abnormal; Notable for the following:    Squamous Epithelial / LPF FEW (*)    Bacteria, UA MANY (*)    Casts HYALINE CASTS (*)    All other components within normal limits  POCT CBG (FASTING - GLUCOSE)-MANUAL ENTRY  URINE CULTURE  MAGNESIUM   Dg Chest 2 View  01/05/2012  *RADIOLOGY REPORT*  Clinical Data: Preoperative respiratory films.  CHEST - 2 VIEW  Comparison: None.  Findings: Lungs are clear.  Heart size  is normal.  No pneumothorax or effusion.  No focal bony abnormality.  IMPRESSION: Negative exam.  Original Report Authenticated By: Bernadene Bell. Maricela Curet, M.D.   Ct Head Wo Contrast  01/06/2012  *RADIOLOGY REPORT*  Clinical Data: Loss of consciousness.  Choking.  Syncope.  CT HEAD WITHOUT CONTRAST  Technique:  Contiguous axial images were obtained from the base of the skull through the vertex without contrast.  Comparison: None.  Findings: The study is mildly degraded by patient motion.  Moderate generalized atrophy is present.  Extensive periventricular subcortical white matter hypoattenuation is evident bilaterally. There are remote lacunar infarcts of the cerebellum bilaterally. The ventricles are proportionate to the degree of atrophy.  No significant extra-axial fluid collection is present.  The paranasal sinuses are clear.  The patient is status post left lens extraction.  There is gas within the left globe. Atherosclerotic calcifications are noted within the cavernous carotid arteries and at the dural margins of the vertebral arteries bilaterally.  IMPRESSION:  1.  No acute intracranial abnormality. 2.  Moderate generalized atrophy white matter disease, likely reflecting the sequelae of chronic microvascular ischemia. 3.  Remote lacunar infarcts of the cerebellum bilaterally. 4.  Postoperative changes of the left globe.  Original Report Authenticated By: Jamesetta Orleans. MATTERN, M.D.   Dg Chest Port 1 View  01/06/2012  *RADIOLOGY REPORT*  Clinical Data: Loss of consciousness and choking.  PORTABLE CHEST - 1 VIEW  Comparison: 01/05/2012  Findings: Single view of the chest was obtained.  Lungs are clear bilaterally.  Stable appearance of the heart and mediastinum.  No evidence for a pneumothorax.  Degenerative changes in the thoracic spine.  IMPRESSION:  No acute chest findings.  Original Report Authenticated By: Richarda Overlie, M.D.    1. Syncope   2. Urinary tract infection   3. Hyponatremia     MDM   The patient presented to the emergency room after a syncopal episode this evening. He denies any symptoms at this point. I reviewed his old records and he was noted to be hyponatremic prior to his surgery. Patient was treated with oral potassium supplements. He continues to be hypokalemic today. I will check a magnesium level.  IV abx have been ordered to treat his UTI.  Pt with slight increase in his troponin but I doubt acute coronary syndrome at this time.  Pt is asymptomatic.  Will continue to monitor       Celene Kras, MD 01/06/12 2145

## 2012-01-06 NOTE — Progress Notes (Signed)
Pt and son were instructed on care of foley cath care by Alona Bene, RN charge nurse. Pt and son both verbalized understanding of foley care and all other instructions. Copy of instructions and script given to pt. Pt also instructed on eye care, eye drops and eye ointment, verbalized understanding. Pt d/c'd via wheelchair with belongings with son and hospital volunteer.

## 2012-01-06 NOTE — ED Notes (Signed)
Pt choked on sandwich, was resolved when ems arrived. Son reported LOC for a minute or so. Had BMx2 and emesis x3 after episode. Hx of having esophagus stretched. Post-op from [facility] for eye procedure today.

## 2012-01-06 NOTE — Progress Notes (Signed)
01/06/2012, 6:45 AM  Mental Status:  Awake, Alert, Oriented  Anterior segment: Cornea  Clear    Anterior Chamber Clear    Lens:    IOL  Intra Ocular Pressure 20 mmHg with Tonopen  Vitreous: Clear 30%gas bubble  Retina:  Attached Good laser reaction   Impression: Excellent result Retina attached Poor view  Final Diagnosis: Active Problems:  * No active hospital problems. *    Plan: start post operative eye drops.  Discharge to home.  Give post operative instructions  Sherrie George 01/06/2012, 6:45 AM

## 2012-01-07 DIAGNOSIS — E871 Hypo-osmolality and hyponatremia: Secondary | ICD-10-CM

## 2012-01-07 DIAGNOSIS — I951 Orthostatic hypotension: Secondary | ICD-10-CM

## 2012-01-07 DIAGNOSIS — N179 Acute kidney failure, unspecified: Secondary | ICD-10-CM

## 2012-01-07 DIAGNOSIS — R55 Syncope and collapse: Secondary | ICD-10-CM

## 2012-01-07 DIAGNOSIS — N390 Urinary tract infection, site not specified: Secondary | ICD-10-CM

## 2012-01-07 LAB — CBC
HCT: 34.9 % — ABNORMAL LOW (ref 39.0–52.0)
Platelets: 116 10*3/uL — ABNORMAL LOW (ref 150–400)
RDW: 15.9 % — ABNORMAL HIGH (ref 11.5–15.5)
WBC: 12.1 10*3/uL — ABNORMAL HIGH (ref 4.0–10.5)

## 2012-01-07 LAB — TSH: TSH: 1.279 u[IU]/mL (ref 0.350–4.500)

## 2012-01-07 LAB — BASIC METABOLIC PANEL
Chloride: 100 mEq/L (ref 96–112)
Creatinine, Ser: 1.88 mg/dL — ABNORMAL HIGH (ref 0.50–1.35)
GFR calc Af Amer: 37 mL/min — ABNORMAL LOW (ref >90)

## 2012-01-07 LAB — GLUCOSE, CAPILLARY

## 2012-01-07 LAB — CARDIAC PANEL(CRET KIN+CKTOT+MB+TROPI)
CK, MB: 5.7 ng/mL — ABNORMAL HIGH (ref 0.3–4.0)
Troponin I: <0.3 ng/mL (ref <0.30)
Troponin I: <0.3 ng/mL (ref <0.30)

## 2012-01-07 LAB — HEMOGLOBIN A1C: Mean Plasma Glucose: 146 mg/dL — ABNORMAL HIGH (ref <117)

## 2012-01-07 MED ORDER — ENSURE COMPLETE PO LIQD
237.0000 mL | Freq: Two times a day (BID) | ORAL | Status: DC
  Administered 2012-01-08 (×2): 237 mL via ORAL

## 2012-01-07 NOTE — Progress Notes (Signed)
Met with Pt and son.  Son feeling that Pt would be better served at [facility], if insurance will cover.  Pt agrees and gave CSW permission to contact his ins and fax his information to [place].  CSW to continue to follow.  Providence Crosby, LCSWA Clinical Social Work [phone]

## 2012-01-07 NOTE — Progress Notes (Signed)
Pt had 3 beats of v tach, no symptoms noted vs charted in epic.  Dr Ardyth Harps text paged and informed.

## 2012-01-07 NOTE — Progress Notes (Signed)
UR completed 

## 2012-01-07 NOTE — Evaluation (Signed)
Physical Therapy Evaluation Patient Details Name: Tanner Owen MRN: [id number] DOB: 12/21/1928 Today's Date: 01/07/2012 Time: 7829-5621 PT Time Calculation (min): 24 min  PT Assessment / Plan / Recommendation Clinical Impression  78 y.o. male who has had 2 surgeries on L eye in past week, was admitted with LOC.  Son reports pt. has eaten very little in past week and that pt is normally very active. Per son family can provide 36* assist at home. Pt at risk for falls due to orthostatic BP, thoough during PT/OT eval he denied dizziness. Pt would benefit from acute PT to maximize safety and independence with mobility.    PT Assessment  Patient needs continued PT services    Follow Up Recommendations  Home health PT    Equipment Recommendations  Rolling walker with 5" wheels    Frequency Min 3X/week    Precautions / Restrictions Precautions Precautions: Fall Precaution Comments: due to orthostatic BP Restrictions Weight Bearing Restrictions: No   Pertinent Vitals/Pain *pt denied pain**      Mobility  Bed Mobility Bed Mobility: Supine to Sit Supine to Sit: 4: Min guard;HOB flat;With rails Transfers Transfers: Sit to Stand;Stand to Sit Sit to Stand: 4: Min assist;With upper extremity assist;From bed Stand to Sit: 4: Min guard;With upper extremity assist;With armrests;To chair/3-in-1 Details for Transfer Assistance: Pt impulsive with mobility. Max VCs for safety, technique with RW and hand placement. Ambulation/Gait Ambulation/Gait Assistance: 4: Min guard Ambulation Distance (Feet): 5 Feet Assistive device: Rolling walker Ambulation/Gait Assistance Details: min/guard due to fall risk 2* orthostatic hypotension Gait Pattern: Within Functional Limits Stairs: No Wheelchair Mobility Wheelchair Mobility: No    Exercises     PT Goals Acute Rehab PT Goals PT Goal Formulation: With patient/family Time For Goal Achievement: 01/21/12 Potential to Achieve Goals: Good Pt  will go Sit to Supine/Side: Independently PT Goal: Sit to Supine/Side - Progress: Goal set today Pt will Ambulate: 51 - 150 feet;with least restrictive assistive device PT Goal: Ambulate - Progress: Goal set today Pt will Perform Home Exercise Program: Independently PT Goal: Perform Home Exercise Program - Progress: Goal set today  Visit Information  Last PT Received On: 01/07/12 Assistance Needed: +1 PT/OT Co-Evaluation/Treatment: Yes    Subjective Data  Subjective: Pt denied dizziness during PT/OT eval.  Son stated pt is generally weaker than his normal baseline.  Patient Stated Goal: be able to [employer] again   Prior Functioning  Home Living Lives With: Alone Available Help at Discharge: Family Type of Home: House Home Access: Stairs to enter Secretary/administrator of Steps: 2 Entrance Stairs-Rails: None Home Layout: One level Bathroom Shower/Tub: Network engineer: None Prior Function Level of Independence: Independent Driving: Yes Vocation: Retired Musician: Clinical cytogeneticist  Overall Cognitive Status: Impaired Area of Impairment: Safety/judgement Arousal/Alertness: Awake/alert Orientation Level: Disoriented to;Time Behavior During Session: WFL for tasks performed Safety/Judgement: Impulsive    Extremity/Trunk Assessment Right Upper Extremity Assessment RUE ROM/Strength/Tone: WFL for tasks assessed Left Upper Extremity Assessment LUE ROM/Strength/Tone: WFL for tasks assessed Right Lower Extremity Assessment RLE ROM/Strength/Tone: Within functional levels RLE Sensation: WFL - Light Touch RLE Coordination: WFL - gross/fine motor Left Lower Extremity Assessment LLE ROM/Strength/Tone: Within functional levels LLE Sensation: WFL - Light Touch LLE Coordination: WFL - gross/fine motor Trunk Assessment Trunk Assessment: Normal   Balance    End of Session PT - End of Session Activity Tolerance:  Patient tolerated treatment well;Treatment limited secondary to medical complications (Comment) (limited  by PT/OT due to orthostatic hypotension) Patient left: in chair;with call bell/phone within reach;with family/visitor present Nurse Communication: Mobility status;Other (comment) (orthostatic BP)   Ralene Bathe Kistler 01/07/2012, 11:32 AM  [phone]

## 2012-01-07 NOTE — Progress Notes (Signed)
Subjective: Very sleepy. Awakens to voice and can answer questions appropriately. Son Tanner Owen, present and updated on plan of care.  Objective: Vital signs in last 24 hours: Temp:  [97.8 F (36.6 C)-98.2 F (36.8 C)] 97.8 F (36.6 C) (04/25 1339) Pulse Rate:  [51-73] 51  (04/25 1339) Resp:  [13-22] 18  (04/25 1339) BP: (109-161)/(53-73) 115/53 mmHg (04/25 1339) SpO2:  [91 %-99 %] 96 % (04/25 1339) Weight:  [75.1 kg (165 lb 9.1 oz)] 75.1 kg (165 lb 9.1 oz) (04/25 0030) Weight change:  Last BM Date: 01/07/12  Intake/Output from previous day: 04/24 0701 - 04/25 0700 In: 455 [I.V.:455] Out: 1000 [Urine:1000]     Physical Exam: General: Alert, awake, oriented x3, in no acute distress. HEENT: No bruits, no goiter. Heart: Regular rate and rhythm, without murmurs, rubs, gallops. Lungs: Clear to auscultation bilaterally. Abdomen: Soft, nontender, nondistended, positive bowel sounds. Extremities: No clubbing cyanosis or edema with positive pedal pulses.    Lab Results: Basic Metabolic Panel:  Basename 01/07/12 0520 01/06/12 2215 01/06/12 2115 01/06/12 2036  NA 129* -- -- 127*  K 3.4* -- -- 2.7*  CL 100 -- -- 94*  CO2 18* -- -- 20  GLUCOSE 121* -- -- 152*  BUN 40* -- -- 44*  CREATININE 1.88* -- -- 2.13*  CALCIUM 8.6 -- -- 8.8  MG -- -- 1.8 --  PHOS -- 3.7 -- --   Liver Function Tests:  [facility] 01/06/12 2036  AST 22  ALT 18  ALKPHOS 36*  BILITOT 1.8*  PROT 6.2  ALBUMIN 3.7   CBC:  Basename 01/07/12 0520 01/06/12 2036  WBC 12.1* 17.1*  NEUTROABS -- 13.9*  HGB 12.6* 14.1  HCT 34.9* 39.1  MCV 84.1 83.4  PLT 116* 131*   Cardiac Enzymes:  Basename 01/07/12 0520 01/06/12 2215  CKTOTAL 246* 256*  CKMB 6.6* 7.0*  CKMBINDEX -- --  TROPONINI <0.30 0.34*   BNP:  Basename 01/06/12 2215  PROBNP 1415.0*   CBG:  Basename 01/07/12 0726 01/06/12 0825 01/06/12 0406 01/06/12 0009 01/05/12 2053 01/05/12 1755  GLUCAP 100* 122* 136* 179* 177* 122*   Hemoglobin  A1C:  Basename 01/06/12 2215  HGBA1C 6.7*   Thyroid Function Tests:  Basename 01/06/12 2215  TSH 1.279  T4TOTAL --  FREET4 --  T3FREE --  THYROIDAB --   Urinalysis:  Basename 01/06/12 2007  COLORURINE YELLOW  LABSPEC 1.017  PHURINE 6.0  GLUCOSEU NEGATIVE  HGBUR LARGE*  BILIRUBINUR NEGATIVE  KETONESUR TRACE*  PROTEINUR 100*  UROBILINOGEN 1.0  NITRITE NEGATIVE  LEUKOCYTESUR MODERATE*    Recent Results (from the past 240 hour(s))  SURGICAL PCR SCREEN     Status: Normal   Collection Time   01/05/12 11:13 AM      Component Value Range Status Comment   MRSA, PCR NEGATIVE  NEGATIVE  Final    Staphylococcus aureus NEGATIVE  NEGATIVE  Final     Studies/Results: Ct Head Wo Contrast  01/06/2012  *RADIOLOGY REPORT*  Clinical Data: Loss of consciousness.  Choking.  Syncope.  CT HEAD WITHOUT CONTRAST  Technique:  Contiguous axial images were obtained from the base of the skull through the vertex without contrast.  Comparison: None.  Findings: The study is mildly degraded by patient motion.  Moderate generalized atrophy is present.  Extensive periventricular subcortical white matter hypoattenuation is evident bilaterally. There are remote lacunar infarcts of the cerebellum bilaterally. The ventricles are proportionate to the degree of atrophy.  No significant extra-axial fluid collection is present.  The paranasal sinuses are clear.  The patient is status post left lens extraction.  There is gas within the left globe. Atherosclerotic calcifications are noted within the cavernous carotid arteries and at the dural margins of the vertebral arteries bilaterally.  IMPRESSION:  1.  No acute intracranial abnormality. 2.  Moderate generalized atrophy white matter disease, likely reflecting the sequelae of chronic microvascular ischemia. 3.  Remote lacunar infarcts of the cerebellum bilaterally. 4.  Postoperative changes of the left globe.  Original Report Authenticated By: Jamesetta Orleans. MATTERN,  M.D.   Dg Chest Port 1 View  01/06/2012  *RADIOLOGY REPORT*  Clinical Data: Loss of consciousness and choking.  PORTABLE CHEST - 1 VIEW  Comparison: 01/05/2012  Findings: Single view of the chest was obtained.  Lungs are clear bilaterally.  Stable appearance of the heart and mediastinum.  No evidence for a pneumothorax.  Degenerative changes in the thoracic spine.  IMPRESSION: No acute chest findings.  Original Report Authenticated By: Richarda Overlie, M.D.    Medications: Scheduled Meds:    . sodium chloride  1,000 mL Intravenous Once  . acetaZOLAMIDE  250 mg Oral TID  . aspirin EC  81 mg Oral Daily  . atorvastatin  20 mg Oral q1800  . bacitracin-polymyxin b  1 application Left Eye QID  . cefTRIAXone (ROCEPHIN)  IV  1 g Intravenous Q24H  . cholecalciferol  1,000 Units Oral Daily  . feeding supplement  237 mL Oral BID BM  . gatifloxacin  1 drop Left Eye QID  . linagliptin  5 mg Oral Daily  . pantoprazole  40 mg Oral Q1200  . potassium chloride  10 mEq Intravenous Once  . potassium chloride  40 mEq Oral Once  . potassium chloride  40 mEq Oral Daily  . propranolol  80 mg Oral BID  . sodium chloride  500 mL Intravenous Once  . sodium chloride  3 mL Intravenous Q12H  . Travoprost (BAK Free)  1 drop Both Eyes QHS   Continuous Infusions:    . sodium chloride 75 mL/hr at 01/06/12 2320  . DISCONTD: sodium chloride     PRN Meds:.HYDROcodone-acetaminophen, ondansetron (ZOFRAN) IV, ondansetron  Assessment/Plan:  Principal Problem:  *Syncope Active Problems:  Orthostasis  ARF (acute renal failure)  UTI (lower urinary tract infection)  Hyponatremia   #1 Syncope: 2/2 orthostasis.  #2 Orthostatic Hypotension: likely 2/2 dehydration in the presence of diuretic. Continue IVF. Patient does appear clinically dehydrated and he has electrolyte abnormalities that would correlate with dehydration as well.   #3 Hypovolemic hyponatremia: continue IVF.  #4 ARF: likely 2/2 dehydration in the  presence of diuretic/ACE-I. Improving.  #5 UTI: continue rocephin pending culture data.  #6 Dispo: likely DC home in am if renal function improved. Will need HHPT/OT.   LOS: 1 day   [facility] Pager: [phone] 01/07/2012, 1:44 PM

## 2012-01-07 NOTE — Progress Notes (Signed)
   CARE MANAGEMENT NOTE 01/07/2012  Patient:  Tanner Owen, Tanner Owen   Account Number:  [id number]  Date Initiated:  01/07/2012  Documentation initiated by:  Raiford Noble  Subjective/Objective Assessment:   pt adm with syncope     Action/Plan:   lives alone   Anticipated DC Date:  01/09/2012   Anticipated DC Plan:  HOME W HOME HEALTH SERVICES  In-house referral  NA      DC Planning Services  CM consult      [facility] Choice  HOME HEALTH  DURABLE MEDICAL EQUIPMENT   Choice offered to / List presented to:  C-4 Adult Children   DME arranged  [facility]      DME agency  NA     HH arranged  HH-1 RN  HH-2 PT  HH-3 OT  HH-4 NURSE'S AIDE      HH agency  NA   Status of service:  In process, will continue to follow Medicare Important Message given?   (If response is "NO", the following Medicare IM given date fields will be blank) Date Medicare IM given:   Date Additional Medicare IM given:    Discharge Disposition:  HOME W HOME HEALTH SERVICES  Per UR Regulation:  Reviewed for med. necessity/level of care/duration of stay  If discussed at Long Length of Stay Meetings, dates discussed:    Comments:  01-07-12 Raiford Noble, RN,BSN,CM [phone] Spoke with pt's son, Tanner Owen at bedside to discuss dc plans. PCP: Dr Darcus Austin. Tanner Owen lives alone but will have family to stay with him initially upon dc. Sabino Niemann at [facility] agency list and will f/u tomorrow to see which agency family chose. Will also need RW as well. Will cont to follow.

## 2012-01-07 NOTE — Progress Notes (Signed)
01-07-12 Spoke with Onalee Hua again, son at bedside. States that he thinks his father would benefit more from ST-SNF than HHC at this time. States family works during the day and would be more interested in SNF in [place] if possible. Made Onalee Hua aware that insurance would have to approve SNF and that we should look at [facility] agency list for a back up. Text paged CSW about interest in SNF rather than home with HHC at this time.  [place], [place]  [phone]

## 2012-01-07 NOTE — Discharge Summary (Signed)
Discharge summary not needed on [facility] patients per medical records. 

## 2012-01-07 NOTE — Progress Notes (Signed)
INITIAL ADULT NUTRITION ASSESSMENT Date: 01/07/2012   Time: 10:28 AM Reason for Assessment: Nutrition risk   ASSESSMENT: Male 78 y.o.  Dx: Syncope  Food/Nutrition Related Hx: Pt admitted with possible choking on sandwich followed by emesis, however per discussion with pt he was not choking on food. No emesis today. Pt with episode of syncope, CT of head was negative for acute intracranial abnormality. Pt had eye surgery on 4/23 and states that ever since then he has not had an appetite. Son reports pt's intake has been poor for the past week and is likely r/t surgery. Pt denies any difficulty chewing - pt with dentures. Pt states occasionally having difficulty swallowing and has history of having esophagus stretched in 2004. Pt may be due for another dilation, recommend MD to discuss with pt.   Hx:  Past Medical History  Diagnosis Date  . Diabetes mellitus   . Hypertension   . Hyperlipidemia   . GERD (gastroesophageal reflux disease)    Related Meds:  Scheduled Meds:   . sodium chloride  1,000 mL Intravenous Once  . acetaZOLAMIDE  250 mg Oral TID  . aspirin EC  81 mg Oral Daily  . atorvastatin  20 mg Oral q1800  . bacitracin-polymyxin b  1 application Left Eye QID  . cefTRIAXone (ROCEPHIN)  IV  1 g Intravenous Q24H  . cholecalciferol  1,000 Units Oral Daily  . gatifloxacin  1 drop Left Eye QID  . linagliptin  5 mg Oral Daily  . pantoprazole  40 mg Oral Q1200  . potassium chloride  10 mEq Intravenous Once  . potassium chloride  40 mEq Oral Once  . potassium chloride  40 mEq Oral Daily  . propranolol  80 mg Oral BID  . sodium chloride  500 mL Intravenous Once  . sodium chloride  3 mL Intravenous Q12H  . Travoprost (BAK Free)  1 drop Both Eyes QHS   Continuous Infusions:   . sodium chloride 75 mL/hr at 01/06/12 2320  . DISCONTD: sodium chloride     PRN Meds:.HYDROcodone-acetaminophen, ondansetron (ZOFRAN) IV, ondansetron  Ht: 5\' 10"  (177.8 cm)  Wt: 165 lb 9.1 oz (75.1  kg)  Ideal Wt: 166 lb % Ideal Wt: 99  Usual Wt: 160 lb % Usual Wt: 103   Body mass index is 23.76 kg/(m^2).   Labs:  CMP     Component Value Date/Time   NA 129* 01/07/2012 0520   K 3.4* 01/07/2012 0520   CL 100 01/07/2012 0520   CO2 18* 01/07/2012 0520   GLUCOSE 121* 01/07/2012 0520   BUN 40* 01/07/2012 0520   CREATININE 1.88* 01/07/2012 0520   CALCIUM 8.6 01/07/2012 0520   PROT 6.2 01/06/2012 2036   ALBUMIN 3.7 01/06/2012 2036   AST 22 01/06/2012 2036   ALT 18 01/06/2012 2036   ALKPHOS 36* 01/06/2012 2036   BILITOT 1.8* 01/06/2012 2036   GFRNONAA 32* 01/07/2012 0520   GFRAA 37* 01/07/2012 0520    Intake/Output Summary (Last 24 hours) at 01/07/12 1033 Last data filed at 01/07/12 0529  Gross per 24 hour  Intake    455 ml  Output   1000 ml  Net   -545 ml   Last BM - 01/06/12  Diet Order: General   IVF:    sodium chloride Last Rate: 75 mL/hr at 01/06/12 2320  DISCONTD: sodium chloride     Estimated Nutritional Needs:   Kcal:1700-2000 Protein:75-90g Fluid:1.7-2L  NUTRITION DIAGNOSIS: -Inadequate oral intake (NI-2.1).  Status: Ongoing  RELATED  TO: poor appetite  AS EVIDENCE BY: pt statement  MONITORING/EVALUATION(Goals): Pt to consume >75% of meals/supplements.   EDUCATION NEEDS: -No education needs identified at this time  INTERVENTION: Ensure Complete BID. Encouraged increased intake. Will monitor.   Dietitian #: [phone]  DOCUMENTATION CODES Per approved criteria  -Not Applicable    Marshall Cork 01/07/2012, 10:28 AM

## 2012-01-07 NOTE — Evaluation (Signed)
Occupational Therapy Evaluation Patient Details Name: Tanner Owen MRN: [id number] DOB: 10-30-1928 Today's Date: 01/07/2012 Time: 1020-1046 OT Time Calculation (min): 26 min  OT Assessment / Plan / Recommendation Clinical Impression  Pt is an 78 yo male who presents with orthostatic BP, recent L eye surgery. Skilled OT recommended to maximize I w/BADLs t supervision level in prep for d/c home with 24/7 A and HHOT.    OT Assessment  Patient needs continued OT Services    Follow Up Recommendations  Home health OT;Supervision/Assistance - 24 hour    Equipment Recommendations  Rolling walker with 5" wheels    Frequency Min 2X/week    Precautions / Restrictions Precautions Precautions: Fall Restrictions Weight Bearing Restrictions: No   Pertinent Vitals/Pain     ADL  Grooming: Performed;Wash/dry face;Set up Where Assessed - Grooming: Unsupported sitting Upper Body Bathing: Simulated;Set up Where Assessed - Upper Body Bathing: Sitting, bed;Unsupported Lower Body Bathing: Simulated;Minimal assistance Where Assessed - Lower Body Bathing: Sit to stand from bed Upper Body Dressing: Simulated;Set up Where Assessed - Upper Body Dressing: Sitting, bed;Unsupported Lower Body Dressing: Simulated;Minimal assistance Where Assessed - Lower Body Dressing: Sit to stand from bed Toilet Transfer: Simulated;Minimal assistance Toilet Transfer Method: Stand pivot Toileting - Clothing Manipulation: Simulated;Minimal assistance Where Assessed - Toileting Clothing Manipulation: Standing Toileting - Hygiene: Simulated;Minimal assistance Where Assessed - Toileting Hygiene: Standing Tub/Shower Transfer: Not assessed Tub/Shower Transfer Method: Not assessed Ambulation Related to ADLs: Eval limited due to orthostatic BP 161/71 in supine, 152/70 seated and 126/71 when standing. Pt reported no dizziness.    OT Goals Acute Rehab OT Goals OT Goal Formulation: With patient/family Time For Goal  Achievement: 01/21/12 Potential to Achieve Goals: Good ADL Goals Pt Will Perform Grooming: with supervision;Standing at sink (X 3 tasks to improve standing activity tolerance.) ADL Goal: Grooming - Progress: Goal set today Pt Will Perform Lower Body Bathing: with supervision;Sit to stand from chair;Sit to stand from bed ADL Goal: Lower Body Bathing - Progress: Goal set today Pt Will Perform Lower Body Dressing: with supervision;Sit to stand from bed;Sit to stand from chair ADL Goal: Lower Body Dressing - Progress: Goal set today Pt Will Transfer to Toilet: with supervision;Ambulation;Regular height toilet ADL Goal: Toilet Transfer - Progress: Goal set today Pt Will Perform Toileting - Clothing Manipulation: with supervision;Standing ADL Goal: Toileting - Clothing Manipulation - Progress: Goal set today Pt Will Perform Toileting - Hygiene: with supervision;Sit to stand from 3-in-1/toilet ADL Goal: Toileting - Hygiene - Progress: Goal set today  Visit Information  Last OT Received On: 01/07/12 Assistance Needed: +1 PT/OT Co-Evaluation/Treatment: Yes    Subjective Data  Subjective: "its hell to get old."   Prior Functioning  Home Living Lives With: Alone Available Help at Discharge: Family Type of Home: House Home Access: Stairs to enter Secretary/administrator of Steps: 2 Entrance Stairs-Rails: None Home Layout: One level Bathroom Shower/Tub: Network engineer: None Prior Function Level of Independence: Independent Driving: Yes Vocation: Retired Musician: Clinical cytogeneticist  Overall Cognitive Status: Impaired Area of Impairment: Safety/judgement Arousal/Alertness: Awake/alert Orientation Level: Disoriented to;Time Behavior During Session: WFL for tasks performed Safety/Judgement: Impulsive    Extremity/Trunk Assessment Right Upper Extremity Assessment RUE ROM/Strength/Tone: WFL for tasks assessed Left  Upper Extremity Assessment LUE ROM/Strength/Tone: WFL for tasks assessed   Mobility Bed Mobility Bed Mobility: Supine to Sit Supine to Sit: 4: Min guard;HOB flat;With rails Transfers Transfers: Sit to Stand;Stand to Sit Sit to Stand: 4:  Min assist;With upper extremity assist;From bed Stand to Sit: 4: Min guard;With upper extremity assist;With armrests;To chair/3-in-1 Details for Transfer Assistance: Pt impulsive with mobility. Max VCs for safety, technique with RW and hand placement.   Exercise    Balance    End of Session OT - End of Session Activity Tolerance: Treatment limited secondary to medical complications (Comment) (orthostatic BP) Patient left: in chair;with call bell/phone within reach;with family/visitor present   Carlie Corpus A OTR/L [phone] 01/07/2012, 11:09 AM

## 2012-01-08 LAB — URINE CULTURE
Colony Count: NO GROWTH
Culture: NO GROWTH

## 2012-01-08 LAB — CBC
MCH: 29.9 pg (ref 26.0–34.0)
MCHC: 34.7 g/dL (ref 30.0–36.0)
MCV: 86.3 fL (ref 78.0–100.0)
Platelets: 95 10*3/uL — ABNORMAL LOW (ref 150–400)
RDW: 15.8 % — ABNORMAL HIGH (ref 11.5–15.5)

## 2012-01-08 LAB — BASIC METABOLIC PANEL
BUN: 32 mg/dL — ABNORMAL HIGH (ref 6–23)
CO2: 18 mEq/L — ABNORMAL LOW (ref 19–32)
Calcium: 8.5 mg/dL (ref 8.4–10.5)
Creatinine, Ser: 1.77 mg/dL — ABNORMAL HIGH (ref 0.50–1.35)
Glucose, Bld: 79 mg/dL (ref 70–99)

## 2012-01-08 MED ORDER — POTASSIUM CHLORIDE CRYS ER 20 MEQ PO TBCR
40.0000 meq | EXTENDED_RELEASE_TABLET | Freq: Once | ORAL | Status: AC
  Administered 2012-01-08: 40 meq via ORAL
  Filled 2012-01-08: qty 2

## 2012-01-08 NOTE — Discharge Summary (Signed)
Physician Discharge Summary  Patient ID: Tanner Owen MRN: [id number] DOB/AGE: Apr 11, 1929 78 y.o.  Admit date: 01/06/2012 Discharge date: 01/08/2012  Primary Care Physician:  Ezequiel Kayser, MD, MD   Discharge Diagnoses:    Principal Problem:  *Syncope Active Problems:  Orthostasis  ARF (acute renal failure)  UTI (lower urinary tract infection)  Hyponatremia    Medication List  As of 01/08/2012  2:21 PM   STOP taking these medications         brimonidine 0.2 % ophthalmic solution      dorzolamide-timolol 22.3-6.8 MG/ML ophthalmic solution      hydrochlorothiazide 25 MG tablet      lisinopril 5 MG tablet      prednisoLONE acetate 1 % ophthalmic suspension         TAKE these medications         acetaZOLAMIDE 250 MG tablet   Commonly known as: DIAMOX   Take 250 mg by mouth 4 times daily.      aspirin EC 81 MG tablet   Take 81 mg by mouth daily.      bacitracin-polymyxin b ophthalmic ointment   Commonly known as: POLYSPORIN   Place 1 application into the left eye 4 (four) times daily. apply to eye every 12 hours while awake      cholecalciferol 1000 UNITS tablet   Commonly known as: VITAMIN D   Take 1,000 Units by mouth daily.      fish oil-omega-3 fatty acids 1000 MG capsule   Take 1 g by mouth daily.      gatifloxacin 0.5 % Soln   Commonly known as: ZYMAXID   Place 1 drop into the left eye 4 (four) times daily.      HYDROcodone-acetaminophen 5-500 MG per tablet   Commonly known as: VICODIN   Take 1 tablet by mouth every 6 (six) hours as needed for pain.      JANUVIA 100 MG tablet   Generic drug: sitaGLIPtin   Take 100 mg by mouth daily.      omeprazole 20 MG capsule   Commonly known as: PRILOSEC   Take 20 mg by mouth daily.      propranolol 80 MG tablet   Commonly known as: INDERAL   Take 80 mg by mouth 2 (two) times daily.      rosuvastatin 5 MG tablet   Commonly known as: CRESTOR   Take 5 mg by mouth daily.      TRAVATAN Z 0.004 % Soln  ophthalmic solution   Generic drug: Travoprost (BAK Free)   Place 1 drop into both eyes at bedtime.             Disposition and Follow-up:  Will be discharged home today in stable and improved condition. Needs to followup with PCP in 2 weeks for BP check and BMET to follow on renal function.  Consults:  None    Significant Diagnostic Studies:  Ct Head Wo Contrast  01/06/2012  *RADIOLOGY REPORT*  Clinical Data: Loss of consciousness.  Choking.  Syncope.  CT HEAD WITHOUT CONTRAST  Technique:  Contiguous axial images were obtained from the base of the skull through the vertex without contrast.  Comparison: None.  Findings: The study is mildly degraded by patient motion.  Moderate generalized atrophy is present.  Extensive periventricular subcortical white matter hypoattenuation is evident bilaterally. There are remote lacunar infarcts of the cerebellum bilaterally. The ventricles are proportionate to the degree of atrophy.  No significant extra-axial fluid collection  is present.  The paranasal sinuses are clear.  The patient is status post left lens extraction.  There is gas within the left globe. Atherosclerotic calcifications are noted within the cavernous carotid arteries and at the dural margins of the vertebral arteries bilaterally.  IMPRESSION:  1.  No acute intracranial abnormality. 2.  Moderate generalized atrophy white matter disease, likely reflecting the sequelae of chronic microvascular ischemia. 3.  Remote lacunar infarcts of the cerebellum bilaterally. 4.  Postoperative changes of the left globe.  Original Report Authenticated By: Jamesetta Orleans. MATTERN, M.D.   Dg Chest Port 1 View  01/06/2012  *RADIOLOGY REPORT*  Clinical Data: Loss of consciousness and choking.  PORTABLE CHEST - 1 VIEW  Comparison: 01/05/2012  Findings: Single view of the chest was obtained.  Lungs are clear bilaterally.  Stable appearance of the heart and mediastinum.  No evidence for a pneumothorax.  Degenerative  changes in the thoracic spine.  IMPRESSION: No acute chest findings.  Original Report Authenticated By: Richarda Overlie, M.D.    Brief H and P: For complete details please refer to admission H and P, but in brief patient is an 78 yo male who presents to [facility] ED with main concern of an episode of "passing out"  the day of the admission. He reports LOC for 2-3 minutes and son got him down to the floor where he regained consciousness. Pt denies any significant events prior to the episode and has not been doing any strenuous activity. He denies chest pain or shortness of breath, no specific aggravating or alleviating factors, no similar episodes in the past. Pt had eye surgery yesterday and was released today. Pt denies specific focal weakness, no headaches, no specific abdominal concerns, no specific urinary concerns. He is feeling fine now.  We were asked to admit him for further evaluation and management.     Hospital Course:  Principal Problem:  *Syncope Active Problems:  Orthostasis  ARF (acute renal failure)  UTI (lower urinary tract infection)  Hyponatremia   #1 Syncope: 2/2 orthostasis. Rest of syncope workup has been negative.  #2 Orthostatic Hypotension: Resolved. Secondary to dehydration in the presence of HCTZ/ACE-I. Has been hydrated with NS, I would recommend he stay off the diuretic and ACE-I for now. He will need followup with his PCP in 2 weeks and may need another agent for BP at that point.  #3 Hyponatremia: Na 132 on DC from 127. Believed 2/2 hypovolemia.  #4 ARF: creatinine improving after rehydration and cessation of HCTZ/lisinopril.  #5 ?UTI: initial UA was dirty but subsequent culture has returned negative. Has received 3 days of rocephin and no further antibiotics are required at this point.  Time spent on Discharge: Greater than 30 minutes.  SignedChaya Jan [facility] Pager: [phone] 01/08/2012, 2:21 PM

## 2012-01-08 NOTE — Progress Notes (Signed)
Physical Therapy Treatment Patient Details Name: Tanner Owen MRN: [id number] DOB: [birth year] Today's Date: 01/08/2012 Time: 2956-2130 PT Time Calculation (min): 22 min  PT Assessment / Plan / Recommendation Comments on Treatment Session  Per son SNF rehab is choice for pt as providing 24/7 assist by family will be difficul. APt definitely will need 24/7 and asssitance w/ safe ADL's. pt in agreement     Follow Up Recommendations  Skilled nursing facility    Equipment Recommendations  None recommended by PT    Frequency Min 3X/week   Plan Discharge plan needs to be updated;Equipment recommendations need to be updated Recommend SNF as pt will require 24/7 assistanc   Precautions / Restrictions Precautions Precautions: Fall   Pertinent Vitals/Pain Eye pain.    Mobility  Bed Mobility Bed Mobility: Supine to Sit;Sitting - Scoot to Edge of Bed Supine to Sit: 5: Supervision Sitting - Scoot to Edge of Bed: 5: Supervision Transfers Transfers: Sit to Stand;Stand to Sit Sit to Stand: 4: Min assist;From bed;With upper extremity assist Stand to Sit: 4: Min guard;To chair/3-in-1;With upper extremity assist Details for Transfer Assistance: vc for safe sit/stand w/ UEs Ambulation/Gait Ambulation/Gait Assistance: 4: Min assist Ambulation Distance (Feet): 100 Feet Assistive device: Rolling walker Ambulation/Gait Assistance Details: Pt then ambulate 100 ft w/ handhold and min assist Gait Pattern: Step-through pattern Gait velocity: slow    Exercises     PT Goals Acute Rehab PT Goals PT Goal Formulation: With patient/family Time For Goal Achievement: 01/21/12 Potential to Achieve Goals: Good Pt will go Supine/Side to Sit: Independently PT Goal: Supine/Side to Sit - Progress: Goal set today Pt will go Sit to Supine/Side: Independently PT Goal: Sit to Supine/Side - Progress: Progressing toward goal Pt will go Sit to Stand: with supervision PT Goal: Sit to Stand - Progress: Goal  set today Pt will go Stand to Sit: with supervision PT Goal: Stand to Sit - Progress: Goal set today Pt will Ambulate: 51 - 150 feet;with least restrictive assistive device PT Goal: Ambulate - Progress: Progressing toward goal  Visit Information  Last PT Received On: 01/08/12 Assistance Needed: +1    Subjective Data  Subjective: I am glad that i can walk. It's hard to walk when I can't see out of one eye   Cognition  Overall Cognitive Status: Impaired Area of Impairment: Safety/judgement Arousal/Alertness: Awake/alert Orientation Level: Disoriented to Behavior During Session: [facility] for tasks performed Safety/Judgement: Decreased awareness of safety precautions Safety/Judgement - Other Comments: pt impulsive Cognition - Other Comments: pt trying to stand up before OT had IV pole unplugged and catheter unhooked from chair. Explained need to wait for assist with lines.    Balance     End of Session PT - End of Session Equipment Utilized During Treatment: Gait belt Activity Tolerance: Patient tolerated treatment well Patient left: in chair;with call bell/phone within reach;with family/visitor present Nurse Communication: Mobility status    Rada Hay 01/08/2012, 2:02 PM [phone]

## 2012-01-08 NOTE — Progress Notes (Signed)
   CARE MANAGEMENT NOTE 01/08/2012  Patient:  KJ, IMBERT   Account Number:  [id number]  Date Initiated:  01/07/2012  Documentation initiated by:  Raiford Noble  Subjective/Objective Assessment:   pt adm with syncope     Action/Plan:   lives alone  FAMILY SUPPORT-- SON DAVID AT BEDSIDE   Anticipated DC Date:  01/08/2012   Anticipated DC Plan:  HOME W HOME HEALTH SERVICES  In-house referral  NA      DC Planning Services  CM consult      [facility] Choice  HOME HEALTH  DURABLE MEDICAL EQUIPMENT   Choice offered to / List presented to:  C-4 Adult Children   DME arranged  WALKER - Lavone Nian      DME agency  [facility]     HH arranged  HH-1 RN  HH-2 PT  HH-3 OT  HH-4 NURSE'S AIDE      HH agency  [facility]   Status of service:  Completed, signed off Medicare Important Message given?   (If response is "NO", the following Medicare IM given date fields will be blank) Date Medicare IM given:   Date Additional Medicare IM given:    Discharge Disposition:  HOME W HOME HEALTH SERVICES  Per UR Regulation:  Reviewed for med. necessity/level of care/duration of stay  If discussed at Long Length of Stay Meetings, dates discussed:    Comments:  01-08-12 ARRANGED HHC RN/PT/OT/HHAIDE SERVICES THRU [facility] PER FAMILY CHOICE. PT TO GO HOME WITH FAMILY SUPPORT INITIALLY. IF PT DOES NOT DO AS WELL, PT WILL GO TO HIS DTR'S HOME IN [facility]. MADE [facility], SUSAN AWARE OF ALL OF THE ABOVE. LECRETIA DELIVERED RW TO PATIENT IN ROOM. PT TO F/U WITH PCP UPON DC. NO FURTHER NEEDS ASSESSED.    01-07-12 Reyes Fifield, RN,BSN,CM [phone] Spoke with pt's son, Onalee Hua at bedside to discuss [employer]. PCP: Dr Darcus Austin. Mr Demarest lives alone but will have family to stay with him initially upon dc. Sabino Niemann at [facility] agency list and will f/u tomorrow to see which agency family chose. Will also need RW as well. Will cont to follow.

## 2012-01-08 NOTE — Progress Notes (Signed)
Patient discharged home with home health services.  Son present at time of discharged.  Patient in stable condition at time of discharge.  Discharge instructions given to patient and son with understanding.

## 2012-01-08 NOTE — Progress Notes (Signed)
Occupational Therapy Treatment Patient Details Name: Tanner Owen MRN: [id number] DOB: April 28, 1929 Today's Date: 01/08/2012 Time: 4098-1191 OT Time Calculation (min): 15 min  OT Assessment / Plan / Recommendation Comments on Treatment Session Pt doing well this visit and family present stating they are planning SNF as 24/7 difficult to arrange. Pt needs continued OT to improve safety and independence with ADL.    Follow Up Recommendations  Family now wanting Skilled nursing facility as 24/7 difficult to arrange. If pt does have to discharge home, recommend Home health OT;Supervision/Assistance - 24 hour    Equipment Recommendations  Rolling walker with 5" wheels    Frequency Min 2X/week   Plan Discharge plan needs to be updated    Precautions / Restrictions Precautions Precautions: Fall Restrictions Weight Bearing Restrictions: No        ADL  Grooming: Performed;Wash/dry hands;Wash/dry face;Minimal assistance;Other (comment) (min guard assist) Where Assessed - Grooming: Standing at sink Lower Body Dressing: Simulated;Minimal assistance;Other (comment) Where Assessed - Lower Body Dressing: Sit to stand from chair Toilet Transfer: Simulated;Minimal assistance;Other (comment) (no assistive device) Toilet Transfer Method: Ambulating ADL Comments: Pt able to don/doff sock with setup but still light min assist for balance in standing to simulate pulling up pants. Pt reporting they prefer [facility] SNF as they are having difficulty with arranging 24/7 for discharge home. Change recommendation therefore to SNF if 24/7 not  available.     OT Goals ADL Goals ADL Goal: Grooming - Progress: Progressing toward goals ADL Goal: Lower Body Dressing - Progress: Progressing toward goals ADL Goal: Toilet Transfer - Progress: Progressing toward goals  Visit Information  Last OT Received On: 01/08/12 Assistance Needed: +1    Subjective Data  Subjective: what do you need me to do Patient Stated  Goal: I want to wash my face (declines full bath)   Prior Functioning       Cognition  Overall Cognitive Status: Impaired Area of Impairment: Safety/judgement Arousal/Alertness: Awake/alert Behavior During Session: WFL for tasks performed Safety/Judgement: Impulsive;Decreased awareness of need for assistance Cognition - Other Comments: pt trying to stand up before OT had IV pole unplugged and catheter unhooked from chair. Explained need to wait for assist with lines.    Mobility Transfers Transfers: Sit to Stand Sit to Stand: 4: Min guard;With upper extremity assist;From chair/3-in-1 Stand to Sit: 4: Min guard;With upper extremity assist;To chair/3-in-1 Details for Transfer Assistance: pt is impulsive with getting up before lines ready to safely transfer.   Exercises    Balance    End of Session OT - End of Session Equipment Utilized During Treatment: Gait belt Activity Tolerance: Patient tolerated treatment well Patient left: in chair;with call bell/phone within reach;with family/visitor present   Lennox Laity [phone] 01/08/2012, 12:47 PM

## 2012-01-08 NOTE — Progress Notes (Signed)
Contacted [facility].  Per Judeth Cornfield, [facility] not contracted with [employer].  Notified Pt and son, Care Coordinator, Aram Beecham, and MD.  Pt to d/c home with 24hr care.  Provided Pt and son in-home caregiver list.  CSW to sign off.  Providence Crosby, LCSWA Clinical Social Work [phone]

## 2012-01-13 ENCOUNTER — Inpatient Hospital Stay (INDEPENDENT_AMBULATORY_CARE_PROVIDER_SITE_OTHER): Payer: Medicare Other | Admitting: Ophthalmology

## 2012-01-13 DIAGNOSIS — H2700 Aphakia, unspecified eye: Secondary | ICD-10-CM

## 2012-01-21 ENCOUNTER — Ambulatory Visit (INDEPENDENT_AMBULATORY_CARE_PROVIDER_SITE_OTHER): Payer: Medicare Other | Admitting: Ophthalmology

## 2012-01-21 DIAGNOSIS — H2700 Aphakia, unspecified eye: Secondary | ICD-10-CM

## 2012-01-28 ENCOUNTER — Other Ambulatory Visit: Payer: Self-pay

## 2012-01-28 ENCOUNTER — Other Ambulatory Visit: Payer: Self-pay | Admitting: Internal Medicine

## 2012-01-28 NOTE — Progress Notes (Signed)
This encounter was created in error - please disregard.

## 2012-02-01 ENCOUNTER — Other Ambulatory Visit: Payer: Self-pay | Admitting: Internal Medicine

## 2012-02-01 MED ORDER — OMEPRAZOLE 20 MG PO CPDR: 20.0000 mg | DELAYED_RELEASE_CAPSULE | Freq: Every day | ORAL | Status: DC

## 2012-02-01 NOTE — Telephone Encounter (Signed)
Refilled rx

## 2012-02-03 ENCOUNTER — Telehealth: Payer: Self-pay

## 2012-02-03 ENCOUNTER — Telehealth: Payer: Self-pay | Admitting: Internal Medicine

## 2012-02-03 NOTE — Telephone Encounter (Signed)
corrected rx for omeprazole from 1 po qd to 2 po qd; then called pt to tell him the rx had been corrected; pt acknowledged

## 2012-02-03 NOTE — Telephone Encounter (Signed)
Pt states Prilosec prescription was sent in for a quantity of 30 and should be 60. Alonna Buckler CMA notified and will take care of correcting the Rx.

## 2012-02-17 ENCOUNTER — Encounter (INDEPENDENT_AMBULATORY_CARE_PROVIDER_SITE_OTHER): Payer: Medicare Other | Admitting: Ophthalmology

## 2012-02-17 DIAGNOSIS — H2700 Aphakia, unspecified eye: Secondary | ICD-10-CM

## 2012-02-29 ENCOUNTER — Other Ambulatory Visit: Payer: Self-pay | Admitting: Internal Medicine

## 2012-03-07 ENCOUNTER — Encounter: Payer: Self-pay | Admitting: Internal Medicine

## 2012-03-07 ENCOUNTER — Ambulatory Visit (INDEPENDENT_AMBULATORY_CARE_PROVIDER_SITE_OTHER): Payer: Medicare Other | Admitting: Internal Medicine

## 2012-03-07 VITALS — BP 172/90 | HR 72 | Ht 70.0 in | Wt 156.0 lb

## 2012-03-07 DIAGNOSIS — R1310 Dysphagia, unspecified: Secondary | ICD-10-CM

## 2012-03-07 DIAGNOSIS — K222 Esophageal obstruction: Secondary | ICD-10-CM

## 2012-03-07 DIAGNOSIS — K219 Gastro-esophageal reflux disease without esophagitis: Secondary | ICD-10-CM

## 2012-03-07 MED ORDER — OMEPRAZOLE 20 MG PO CPDR: 20.0000 mg | DELAYED_RELEASE_CAPSULE | Freq: Two times a day (BID) | ORAL | Status: DC

## 2012-03-07 NOTE — Progress Notes (Signed)
HISTORY OF PRESENT ILLNESS:  Tanner Owen is a 78 y.o. male with multiple medical problems as listed below. I have follow the patient previously for severe erosive esophagitis complicated by severe stricturing of the esophagus. He has been on chronic PPI therapy and required multiple endoscopies with dilation. His last such endoscopy was performed in October of 2011. He was dilated with a balloon, up to 17 mm. He has continued on omeprazole 20 mg twice a day. He was to followup in 2-3 months for repeat dilation. He has not done so. He states that "I'm doing pretty good". When pressed further, he does seem to be having some intermittent dysphagia. He states he is not interested in repeat endoscopy at this time. He has had some problems with cataract surgery. He denies heartburn or indigestion. He does request a medication refill. GI review of systems is otherwise negative.  REVIEW OF SYSTEMS:  All non-GI ROS negative except for hearing problems in vision change  Past Medical History  Diagnosis Date  . B12 deficiency   . Hypertension   . Hyperlipidemia   . GERD (gastroesophageal reflux disease)   . Anemia   . Thrombocytopenia   . Microalbuminuria   . Renal artery stenosis   . Chronic kidney disease (CKD), stage III (moderate)   . Diabetes mellitus   . Diverticulitis   . Esophageal stricture   . Gout     Past Surgical History  Procedure Date  . Hernia repair     hernia surgery x2 approx.30 years ago  . Eye surgery     caratarct surgery on 11/29/11, left eye  . Gas/fluid exchange 01/05/2012    Procedure: GAS/FLUID EXCHANGE;  Surgeon: Sherrie George, MD;  Location: [facility] OR;  Service: Ophthalmology;  Laterality: Left;  . Pars plana vitrectomy 01/05/2012    Procedure: PARS PLANA VITRECTOMY WITH 25 GAUGE;  Surgeon: Sherrie George, MD;  Location: [facility] OR;  Service: Ophthalmology;  Laterality: Left;  . Placement and suture of secondary intraocular lens 01/05/2012    Procedure: PLACEMENT AND  SUTURE OF SECONDARY INTRAOCULAR LENS;  Surgeon: Sherrie George, MD;  Location: [facility] OR;  Service: Ophthalmology;  Laterality: Left;  . Esophageal dilation     Social History Tanner Owen  reports that he quit smoking about 23 years ago. His smoking use included Cigarettes. He has a 12.5 pack-year smoking history. He has never used smokeless tobacco. He reports that he does not drink alcohol or use illicit drugs.  family history includes Stroke in his mother.  No Known Allergies     PHYSICAL EXAMINATION: Vital signs: BP 172/90  Pulse 72  Ht 5\' 10"  (1.778 m)  Wt 156 lb (70.761 kg)  BMI 22.38 kg/m2 General: Well-developed, well-nourished, no acute distress HEENT: Sclerae are anicteric, conjunctiva pink. Oral mucosa intact Lungs: Clear Heart: Regular Abdomen: soft, nontender, nondistended, no obvious ascites, no peritoneal signs, normal bowel sounds. No organomegaly. Extremities: No edema Psychiatric: alert and oriented x3. Cooperative      ASSESSMENT:  #1. GERD complicated by severe erosive esophagitis and high-grade peptic stricture. #2. Dysphagia. Related to the same    PLAN:  #1. Continue omeprazole 20 mg twice a day. Prescribed.  #2. Reflux precautions #3. I have recommended repeat endoscopy with esophageal dilation given his prior history. He tends to minimize symptoms. However, despite as recommendation, he wishes to be followed expectantly. He does tell me that he would call if his symptoms worsen. Otherwise, I would see him in  one year he will continue his general medical care with Dr. Waynard Edwards.

## 2012-03-07 NOTE — Patient Instructions (Addendum)
We have sent the following medications to your pharmacy for you to pick up at your convenience:  omeprezole

## 2012-03-14 ENCOUNTER — Other Ambulatory Visit: Payer: Self-pay | Admitting: Internal Medicine

## 2012-03-16 ENCOUNTER — Ambulatory Visit
Admission: RE | Admit: 2012-03-16 | Discharge: 2012-03-16 | Disposition: A | Discharge status: Home / Self Care | Payer: Medicare Other | Source: Ambulatory Visit | Attending: Internal Medicine | Admitting: Internal Medicine

## 2012-03-28 ENCOUNTER — Other Ambulatory Visit: Payer: Self-pay | Admitting: Internal Medicine

## 2012-04-27 ENCOUNTER — Encounter (INDEPENDENT_AMBULATORY_CARE_PROVIDER_SITE_OTHER): Payer: Medicare Other | Admitting: Ophthalmology

## 2012-04-27 DIAGNOSIS — H33309 Unspecified retinal break, unspecified eye: Secondary | ICD-10-CM

## 2012-04-27 DIAGNOSIS — H2510 Age-related nuclear cataract, unspecified eye: Secondary | ICD-10-CM

## 2012-04-27 DIAGNOSIS — H35039 Hypertensive retinopathy, unspecified eye: Secondary | ICD-10-CM

## 2012-04-27 DIAGNOSIS — H43819 Vitreous degeneration, unspecified eye: Secondary | ICD-10-CM

## 2012-04-27 DIAGNOSIS — I10 Essential (primary) hypertension: Secondary | ICD-10-CM

## 2013-04-28 ENCOUNTER — Ambulatory Visit (INDEPENDENT_AMBULATORY_CARE_PROVIDER_SITE_OTHER): Payer: Medicare Other | Admitting: Ophthalmology

## 2013-06-08 ENCOUNTER — Ambulatory Visit (INDEPENDENT_AMBULATORY_CARE_PROVIDER_SITE_OTHER): Payer: Medicare Other | Admitting: Ophthalmology

## 2013-06-08 DIAGNOSIS — H2510 Age-related nuclear cataract, unspecified eye: Secondary | ICD-10-CM

## 2013-06-08 DIAGNOSIS — H43819 Vitreous degeneration, unspecified eye: Secondary | ICD-10-CM

## 2013-06-08 DIAGNOSIS — I10 Essential (primary) hypertension: Secondary | ICD-10-CM

## 2013-06-08 DIAGNOSIS — E1139 Type 2 diabetes mellitus with other diabetic ophthalmic complication: Secondary | ICD-10-CM

## 2013-06-08 DIAGNOSIS — E11319 Type 2 diabetes mellitus with unspecified diabetic retinopathy without macular edema: Secondary | ICD-10-CM

## 2013-06-08 DIAGNOSIS — H35039 Hypertensive retinopathy, unspecified eye: Secondary | ICD-10-CM

## 2014-07-11 ENCOUNTER — Ambulatory Visit (INDEPENDENT_AMBULATORY_CARE_PROVIDER_SITE_OTHER): Payer: Medicare Other | Admitting: Ophthalmology

## 2014-10-15 ENCOUNTER — Ambulatory Visit: Payer: Self-pay | Admitting: Physician Assistant

## 2015-04-05 ENCOUNTER — Emergency Department (HOSPITAL_COMMUNITY): Payer: Medicare Other

## 2015-04-05 ENCOUNTER — Emergency Department (HOSPITAL_COMMUNITY)
Admission: EM | Admit: 2015-04-05 | Discharge: 2015-04-05 | Disposition: A | Discharge status: Home / Self Care | Payer: Medicare Other | Attending: Emergency Medicine | Admitting: Emergency Medicine

## 2015-04-05 ENCOUNTER — Encounter (HOSPITAL_COMMUNITY): Payer: Self-pay

## 2015-04-05 DIAGNOSIS — N183 Chronic kidney disease, stage 3 (moderate): Secondary | ICD-10-CM | POA: Diagnosis not present

## 2015-04-05 DIAGNOSIS — R531 Weakness: Secondary | ICD-10-CM | POA: Insufficient documentation

## 2015-04-05 DIAGNOSIS — Z008 Encounter for other general examination: Secondary | ICD-10-CM | POA: Diagnosis not present

## 2015-04-05 DIAGNOSIS — K5900 Constipation, unspecified: Secondary | ICD-10-CM | POA: Diagnosis not present

## 2015-04-05 DIAGNOSIS — Z87891 Personal history of nicotine dependence: Secondary | ICD-10-CM | POA: Diagnosis not present

## 2015-04-05 DIAGNOSIS — N390 Urinary tract infection, site not specified: Secondary | ICD-10-CM | POA: Diagnosis not present

## 2015-04-05 DIAGNOSIS — K219 Gastro-esophageal reflux disease without esophagitis: Secondary | ICD-10-CM | POA: Insufficient documentation

## 2015-04-05 DIAGNOSIS — I701 Atherosclerosis of renal artery: Secondary | ICD-10-CM | POA: Insufficient documentation

## 2015-04-05 DIAGNOSIS — Z79899 Other long term (current) drug therapy: Secondary | ICD-10-CM | POA: Insufficient documentation

## 2015-04-05 DIAGNOSIS — E119 Type 2 diabetes mellitus without complications: Secondary | ICD-10-CM | POA: Diagnosis not present

## 2015-04-05 DIAGNOSIS — Z8739 Personal history of other diseases of the musculoskeletal system and connective tissue: Secondary | ICD-10-CM | POA: Insufficient documentation

## 2015-04-05 DIAGNOSIS — I129 Hypertensive chronic kidney disease with stage 1 through stage 4 chronic kidney disease, or unspecified chronic kidney disease: Secondary | ICD-10-CM | POA: Diagnosis not present

## 2015-04-05 DIAGNOSIS — Z862 Personal history of diseases of the blood and blood-forming organs and certain disorders involving the immune mechanism: Secondary | ICD-10-CM | POA: Diagnosis not present

## 2015-04-05 LAB — I-STAT TROPONIN, ED: Troponin i, poc: 0.05 ng/mL (ref 0.00–0.08)

## 2015-04-05 LAB — CBC WITH DIFFERENTIAL/PLATELET
BASOS ABS: 0 10*3/uL (ref 0.0–0.1)
Basophils Relative: 0 % (ref 0–1)
Eosinophils Absolute: 0 10*3/uL (ref 0.0–0.7)
Eosinophils Relative: 0 % (ref 0–5)
HEMATOCRIT: 39 % (ref 39.0–52.0)
HEMOGLOBIN: 13.4 g/dL (ref 13.0–17.0)
LYMPHS PCT: 7 % — AB (ref 12–46)
Lymphs Abs: 0.3 10*3/uL — ABNORMAL LOW (ref 0.7–4.0)
MCH: 31.2 pg (ref 26.0–34.0)
MCHC: 34.4 g/dL (ref 30.0–36.0)
MCV: 90.7 fL (ref 78.0–100.0)
MONO ABS: 0.2 10*3/uL (ref 0.1–1.0)
Monocytes Relative: 5 % (ref 3–12)
Neutro Abs: 3.9 10*3/uL (ref 1.7–7.7)
Neutrophils Relative %: 88 % — ABNORMAL HIGH (ref 43–77)
PLATELETS: 55 10*3/uL — AB (ref 150–400)
RBC: 4.3 MIL/uL (ref 4.22–5.81)
RDW: 14.4 % (ref 11.5–15.5)
WBC: 4.5 10*3/uL (ref 4.0–10.5)

## 2015-04-05 LAB — COMPREHENSIVE METABOLIC PANEL
ALK PHOS: 42 U/L (ref 38–126)
ALT: 28 U/L (ref 17–63)
ANION GAP: 9 (ref 5–15)
AST: 48 U/L — AB (ref 15–41)
Albumin: 3.7 g/dL (ref 3.5–5.0)
BILIRUBIN TOTAL: 2.4 mg/dL — AB (ref 0.3–1.2)
BUN: 31 mg/dL — ABNORMAL HIGH (ref 6–20)
CO2: 27 mmol/L (ref 22–32)
Calcium: 9.5 mg/dL (ref 8.9–10.3)
Chloride: 101 mmol/L (ref 101–111)
Creatinine, Ser: 1.6 mg/dL — ABNORMAL HIGH (ref 0.61–1.24)
GFR calc non Af Amer: 38 mL/min — ABNORMAL LOW (ref >60)
GFR, EST AFRICAN AMERICAN: 44 mL/min — AB (ref >60)
Glucose, Bld: 155 mg/dL — ABNORMAL HIGH (ref 65–99)
POTASSIUM: 3.9 mmol/L (ref 3.5–5.1)
SODIUM: 137 mmol/L (ref 135–145)
Total Protein: 6.7 g/dL (ref 6.5–8.1)

## 2015-04-05 LAB — URINALYSIS, ROUTINE W REFLEX MICROSCOPIC
GLUCOSE, UA: NEGATIVE mg/dL
KETONES UR: NEGATIVE mg/dL
LEUKOCYTES UA: NEGATIVE
Nitrite: NEGATIVE
Protein, ur: >300 mg/dL — AB
SPECIFIC GRAVITY, URINE: 1.026 (ref 1.005–1.030)
UROBILINOGEN UA: 2 mg/dL — AB (ref 0.0–1.0)
pH: 5.5 (ref 5.0–8.0)

## 2015-04-05 LAB — URINE MICROSCOPIC-ADD ON

## 2015-04-05 MED ORDER — CEPHALEXIN 500 MG PO CAPS: 500.0000 mg | ORAL_CAPSULE | Freq: Four times a day (QID) | ORAL | Status: DC

## 2015-04-05 MED ORDER — CEPHALEXIN 250 MG PO CAPS
250.0000 mg | ORAL_CAPSULE | Freq: Once | ORAL | Status: AC
  Administered 2015-04-05: 250 mg via ORAL
  Filled 2015-04-05: qty 1

## 2015-04-05 NOTE — ED Notes (Signed)
EKG given to EDP,Ray,MD., for review. 

## 2015-04-05 NOTE — ED Provider Notes (Signed)
CSN: [id number]     Arrival date & time 04/05/15  1754 History   First MD Initiated Contact with Patient 04/05/15 1826     Chief Complaint  Patient presents with  . Weakness  . Constipation     (Consider location/radiation/quality/duration/timing/severity/associated sxs/prior Treatment) HPI Comments: Patient with past medical history of hypertension, hyperlipidemia, anemia, CAD, and diabetes presents to the emergency department with chief complaint of weakness. Patient states that he has been feeling weak for the past several weeks. He states that worsened today, when he felt unsteady on his feet. He is accompanied by his son's, who states that he has been less active than normal. Patient denies any slurred speech, or difficulty moving his arms or legs. He denies any chest pain, shortness of breath, abdominal pain. States that he has had chronic constipation.  The history is provided by the patient. No language interpreter was used.    Past Medical History  Diagnosis Date  . B12 deficiency   . Hypertension   . Hyperlipidemia   . GERD (gastroesophageal reflux disease)   . Anemia   . Thrombocytopenia   . Microalbuminuria   . Renal artery stenosis   . Chronic kidney disease (CKD), stage III (moderate)   . Diabetes mellitus   . Diverticulitis   . Esophageal stricture   . Gout    Past Surgical History  Procedure Laterality Date  . Hernia repair      hernia surgery x2 approx.30 years ago  . Eye surgery      caratarct surgery on 11/29/11, left eye  . Gas/fluid exchange  01/05/2012    Procedure: GAS/FLUID EXCHANGE;  Surgeon: Sherrie George, MD;  Location: [facility] OR;  Service: Ophthalmology;  Laterality: Left;  . Pars plana vitrectomy  01/05/2012    Procedure: PARS PLANA VITRECTOMY WITH 25 GAUGE;  Surgeon: Sherrie George, MD;  Location: [facility] OR;  Service: Ophthalmology;  Laterality: Left;  . Placement and suture of secondary intraocular lens  01/05/2012    Procedure: PLACEMENT AND SUTURE OF  SECONDARY INTRAOCULAR LENS;  Surgeon: Sherrie George, MD;  Location: [facility] OR;  Service: Ophthalmology;  Laterality: Left;  . Esophageal dilation     Family History  Problem Relation Age of Onset  . Stroke Mother    History  Substance Use Topics  . Smoking status: Former Smoker -- 0.25 packs/day for 50 years    Types: Cigarettes    Quit date: 01/05/1989  . Smokeless tobacco: Never Used     Comment: stopped smoking in 1990  . Alcohol Use: No     Comment: quit drinking in 1990    Review of Systems  Constitutional: Positive for fatigue. Negative for fever and chills.  Respiratory: Negative for shortness of breath.   Cardiovascular: Negative for chest pain.  Gastrointestinal: Negative for nausea, vomiting, diarrhea and constipation.  Genitourinary: Negative for dysuria.  Neurological: Positive for weakness.  All other systems reviewed and are negative.     Allergies  Review of patient's allergies indicates no known allergies.  Home Medications   Prior to Admission medications   Medication Sig Start Date End Date Taking? Authorizing Provider  acetaminophen (TYLENOL) 500 MG tablet Take 500 mg by mouth every 6 (six) hours as needed for moderate pain or headache.   Yes Historical Provider, MD  Omega-3 Fatty Acids (FISH OIL PO) Take 5 mLs by mouth daily.   Yes Historical Provider, MD  omeprazole (PRILOSEC) 20 MG capsule TAKE TWO CAPSULES BY MOUTH DAILY Patient  taking differently: TAKE ONE CAPSULES BY MOUTH DAILY 03/28/12  Yes Hilarie Fredrickson, MD  propranolol (INDERAL) 80 MG tablet Take 40 mg by mouth 2 (two) times daily.  12/26/11  Yes Historical Provider, MD   BP 172/77 mmHg  Pulse 90  Temp(Src) 99.3 F (37.4 C) (Oral)  Resp 16  SpO2 94% Physical Exam  Constitutional: He is oriented to person, place, and time. He appears well-developed and well-nourished.  HENT:  Head: Normocephalic and atraumatic.  Eyes: Conjunctivae and EOM are normal. Pupils are equal, round, and reactive to  light. Right eye exhibits no discharge. Left eye exhibits no discharge. No scleral icterus.  Neck: Normal range of motion. Neck supple. No JVD present.  Cardiovascular: Normal rate, regular rhythm and normal heart sounds.  Exam reveals no gallop and no friction rub.   No murmur heard. Pulmonary/Chest: Effort normal and breath sounds normal. No respiratory distress. He has no wheezes. He has no rales. He exhibits no tenderness.  CTAB  Abdominal: Soft. He exhibits no distension and no mass. There is no tenderness. There is no rebound and no guarding.  No focal abdominal tenderness, no RLQ tenderness or pain at McBurney's point, no RUQ tenderness or Murphy's sign, no left-sided abdominal tenderness, no fluid wave, or signs of peritonitis   Musculoskeletal: Normal range of motion. He exhibits no edema or tenderness.  Moves all extremities  Neurological: He is alert and oriented to person, place, and time.  CN III-12 intact, 5/5 range of motion and strength of extremities, normal finger to nose, and no pronator drift, ambulates without difficulty, steady gait  Skin: Skin is warm and dry.  Psychiatric: He has a normal mood and affect. His behavior is normal. Judgment and thought content normal.  Nursing note and vitals reviewed.   ED Course  Procedures (including critical care time) Labs Review Results for orders placed or performed during the hospital encounter of 04/05/15  CBC with Differential/Platelet  Result Value Ref Range   WBC 4.5 4.0 - 10.5 K/uL   RBC 4.30 4.22 - 5.81 MIL/uL   Hemoglobin 13.4 13.0 - 17.0 g/dL   HCT 16.1 09.6 - 04.5 %   MCV 90.7 78.0 - 100.0 fL   MCH 31.2 26.0 - 34.0 pg   MCHC 34.4 30.0 - 36.0 g/dL   RDW 40.9 81.1 - 91.4 %   Platelets 55 (L) 150 - 400 K/uL   Neutrophils Relative % 88 (H) 43 - 77 %   Neutro Abs 3.9 1.7 - 7.7 K/uL   Lymphocytes Relative 7 (L) 12 - 46 %   Lymphs Abs 0.3 (L) 0.7 - 4.0 K/uL   Monocytes Relative 5 3 - 12 %   Monocytes Absolute 0.2  0.1 - 1.0 K/uL   Eosinophils Relative 0 0 - 5 %   Eosinophils Absolute 0.0 0.0 - 0.7 K/uL   Basophils Relative 0 0 - 1 %   Basophils Absolute 0.0 0.0 - 0.1 K/uL  Comprehensive metabolic panel  Result Value Ref Range   Sodium 137 135 - 145 mmol/L   Potassium 3.9 3.5 - 5.1 mmol/L   Chloride 101 101 - 111 mmol/L   CO2 27 22 - 32 mmol/L   Glucose, Bld 155 (H) 65 - 99 mg/dL   BUN 31 (H) 6 - 20 mg/dL   Creatinine, Ser 7.82 (H) 0.61 - 1.24 mg/dL   Calcium 9.5 8.9 - 95.6 mg/dL   Total Protein 6.7 6.5 - 8.1 g/dL   Albumin 3.7 3.5 -  5.0 g/dL   AST 48 (H) 15 - 41 U/L   ALT 28 17 - 63 U/L   Alkaline Phosphatase 42 38 - 126 U/L   Total Bilirubin 2.4 (H) 0.3 - 1.2 mg/dL   GFR calc non Af Amer 38 (L) >60 mL/min   GFR calc Af Amer 44 (L) >60 mL/min   Anion gap 9 5 - 15  Urinalysis, Routine w reflex microscopic (not at [facility])  Result Value Ref Range   Color, Urine AMBER (A) YELLOW   APPearance CLOUDY (A) CLEAR   Specific Gravity, Urine 1.026 1.005 - 1.030   pH 5.5 5.0 - 8.0   Glucose, UA NEGATIVE NEGATIVE mg/dL   Hgb urine dipstick MODERATE (A) NEGATIVE   Bilirubin Urine MODERATE (A) NEGATIVE   Ketones, ur NEGATIVE NEGATIVE mg/dL   Protein, ur >161 (A) NEGATIVE mg/dL   Urobilinogen, UA 2.0 (H) 0.0 - 1.0 mg/dL   Nitrite NEGATIVE NEGATIVE   Leukocytes, UA NEGATIVE NEGATIVE  Urine microscopic-add on  Result Value Ref Range   Squamous Epithelial / LPF RARE RARE   WBC, UA 0-2 <3 WBC/hpf   RBC / HPF 21-50 <3 RBC/hpf   Bacteria, UA RARE RARE   Casts GRANULAR CAST (A) NEGATIVE  I-Stat Troponin, ED (not at [facility])  Result Value Ref Range   Troponin i, poc 0.05 0.00 - 0.08 ng/mL   Comment 3           Dg Chest 2 View  04/05/2015   CLINICAL DATA:  Increasing generalized weakness over the past few days. Current history of hypertension and diabetes. Former long-time smoker.  EXAM: CHEST  2 VIEW  COMPARISON:  01/06/2012, 01/05/2012.  FINDINGS: Cardiac silhouette normal in size, unchanged. Thoracic  aorta mildly tortuous and atherosclerotic, unchanged. Mildly prominent central pulmonary arteries, unchanged. Lungs clear. Bronchovascular markings normal. Pulmonary vascularity normal. No visible pleural effusions. No pneumothorax. Degenerative changes and DISH involving the thoracic spine.  IMPRESSION: No acute cardiopulmonary disease.  Stable examination.   Electronically Signed   By: Hulan Saas M.D.   On: 04/05/2015 20:46      EKG Interpretation None      MDM   Final diagnoses:  Encounter for medical assessment  Weakness  UTI (lower urinary tract infection)    Patient here with worsening weakness 2 weeks. Will check labs, chest x-ray, and urinalysis. Neurovascularly intact.  Ambulates without difficulty.  10:11 PM Patient seen by and discussed with Dr. Rosalia Hammers, who recommends treatment for potential UTI with keflex.  Will send urine for culture.  Remaining labs are baseline or better.  Will recommend close follow-up with primary care provider next week.  Patient is well appearing.  Plan discussed with the patient's sons who are in agreement. He is stable and ready for discharge.    Roxy Horseman, PA-C 04/05/15 2212  Margarita Grizzle, MD 04/06/15 [phone]

## 2015-04-05 NOTE — ED Notes (Signed)
Patient's family reports that the patient had some blood work done 2 days ago. Patient has had increased weakness. Patient is requiring assistance to help  Ambulate and to get up and down from sofa.

## 2015-04-05 NOTE — ED Notes (Addendum)
Pt. Ambulated to the bathroom and back to his room with cane. Pt. Gait steady on his feet. PA.,Rob made aware.

## 2015-04-05 NOTE — ED Notes (Signed)
Patient transported to X-ray 

## 2015-04-05 NOTE — Discharge Instructions (Signed)
Urinary Tract Infection °Urinary tract infections (UTIs) can develop anywhere along your urinary tract. Your urinary tract is your body's drainage system for removing wastes and extra water. Your urinary tract includes two kidneys, two ureters, a bladder, and a urethra. Your kidneys are a pair of bean-shaped organs. Each kidney is about the size of your fist. They are located below your ribs, one on each side of your spine. °CAUSES °Infections are caused by microbes, which are microscopic organisms, including fungi, viruses, and bacteria. These organisms are so small that they can only be seen through a microscope. Bacteria are the microbes that most commonly cause UTIs. °SYMPTOMS  °Symptoms of UTIs may vary by age and gender of the patient and by the location of the infection. Symptoms in young women typically include a frequent and intense urge to urinate and a painful, burning feeling in the bladder or urethra during urination. Older women and men are more likely to be tired, shaky, and weak and have muscle aches and abdominal pain. A fever may mean the infection is in your kidneys. Other symptoms of a kidney infection include pain in your back or sides below the ribs, nausea, and vomiting. °DIAGNOSIS °To diagnose a UTI, your caregiver will ask you about your symptoms. Your caregiver also will ask to provide a urine sample. The urine sample will be tested for bacteria and white blood cells. White blood cells are made by your body to help fight infection. °TREATMENT  °Typically, UTIs can be treated with medication. Because most UTIs are caused by a bacterial infection, they usually can be treated with the use of antibiotics. The choice of antibiotic and length of treatment depend on your symptoms and the type of bacteria causing your infection. °HOME CARE INSTRUCTIONS °· If you were prescribed antibiotics, take them exactly as your caregiver instructs you. Finish the medication even if you feel better after you  have only taken some of the medication. °· Drink enough water and fluids to keep your urine clear or pale yellow. °· Avoid caffeine, tea, and carbonated beverages. They tend to irritate your bladder. °· Empty your bladder often. Avoid holding urine for long periods of time. °· Empty your bladder before and after sexual intercourse. °· After a bowel movement, women should cleanse from front to back. Use each tissue only once. °SEEK MEDICAL CARE IF:  °· You have back pain. °· You develop a fever. °· Your symptoms do not begin to resolve within 3 days. °SEEK IMMEDIATE MEDICAL CARE IF:  °· You have severe back pain or lower abdominal pain. °· You develop chills. °· You have nausea or vomiting. °· You have continued burning or discomfort with urination. °MAKE SURE YOU:  °· Understand these instructions. °· Will watch your condition. °· Will get help right away if you are not doing well or get worse. °Document Released: 06/10/2005 Document Revised: 03/01/2012 Document Reviewed: 10/09/2011 °Exit[employer]® Patient Information ©2015 [employer]. This information is not intended to replace advice given to you by your health care provider. Make sure you discuss any questions you have with your health care provider. ° ° °Weakness °Weakness is a lack of strength. It may be felt all over the body (generalized) or in one specific part of the body (focal). Some causes of weakness can be serious. You may need further medical evaluation, especially if you are elderly or you have a history of immunosuppression (such as chemotherapy or HIV), kidney disease, heart disease, or diabetes. °CAUSES  °Weakness can   be caused by many different things, including: °· Infection. °· Physical exhaustion. °· Internal bleeding or other blood loss that results in a lack of red blood cells (anemia). °· Dehydration. This cause is more common in elderly people. °· Side effects or electrolyte abnormalities from medicines, such as pain medicines or  sedatives. °· Emotional distress, anxiety, or depression. °· Circulation problems, especially severe peripheral arterial disease. °· Heart disease, such as rapid atrial fibrillation, bradycardia, or heart failure. °· Nervous system disorders, such as Guillain-Barré syndrome, multiple sclerosis, or stroke. °DIAGNOSIS  °To find the cause of your weakness, your caregiver will take your history and perform a physical exam. Lab tests or X-rays may also be ordered, if needed. °TREATMENT  °Treatment of weakness depends on the cause of your symptoms and can vary greatly. °HOME CARE INSTRUCTIONS  °· Rest as needed. °· Eat a well-balanced diet. °· Try to get some exercise every day. °· Only take over-the-counter or prescription medicines as directed by your caregiver. °SEEK MEDICAL CARE IF:  °· Your weakness seems to be getting worse or spreads to other parts of your body. °· You develop new aches or pains. °SEEK IMMEDIATE MEDICAL CARE IF:  °· You cannot perform your normal daily activities, such as getting dressed and feeding yourself. °· You cannot walk up and down stairs, or you feel exhausted when you do so. °· You have shortness of breath or chest pain. °· You have difficulty moving parts of your body. °· You have weakness in only one area of the body or on only one side of the body. °· You have a fever. °· You have trouble speaking or swallowing. °· You cannot control your bladder or bowel movements. °· You have black or bloody vomit or stools. °MAKE SURE YOU: °· Understand these instructions. °· Will watch your condition. °· Will get help right away if you are not doing well or get worse. °Document Released: 08/31/2005 Document Revised: 03/01/2012 Document Reviewed: 10/30/2011 °Exit[employer]® Patient Information ©2015 [employer]. This information is not intended to replace advice given to you by your health care provider. Make sure you discuss any questions you have with your health care provider. ° °

## 2015-04-07 LAB — URINE CULTURE: CULTURE: NO GROWTH

## 2015-04-13 ENCOUNTER — Inpatient Hospital Stay (HOSPITAL_COMMUNITY)
Admission: EM | Admit: 2015-04-13 | Discharge: 2015-04-23 | DRG: 391 | Disposition: A | Discharge status: Hospice | Payer: Medicare Other | Attending: Internal Medicine | Admitting: Internal Medicine

## 2015-04-13 ENCOUNTER — Emergency Department (HOSPITAL_COMMUNITY): Payer: Medicare Other

## 2015-04-13 ENCOUNTER — Encounter (HOSPITAL_COMMUNITY): Payer: Self-pay | Admitting: Emergency Medicine

## 2015-04-13 DIAGNOSIS — T447X5A Adverse effect of beta-adrenoreceptor antagonists, initial encounter: Secondary | ICD-10-CM | POA: Diagnosis present

## 2015-04-13 DIAGNOSIS — R1314 Dysphagia, pharyngoesophageal phase: Secondary | ICD-10-CM | POA: Diagnosis present

## 2015-04-13 DIAGNOSIS — Z515 Encounter for palliative care: Secondary | ICD-10-CM | POA: Diagnosis present

## 2015-04-13 DIAGNOSIS — Z7189 Other specified counseling: Secondary | ICD-10-CM | POA: Diagnosis present

## 2015-04-13 DIAGNOSIS — K219 Gastro-esophageal reflux disease without esophagitis: Secondary | ICD-10-CM | POA: Diagnosis present

## 2015-04-13 DIAGNOSIS — R112 Nausea with vomiting, unspecified: Secondary | ICD-10-CM | POA: Diagnosis not present

## 2015-04-13 DIAGNOSIS — Z66 Do not resuscitate: Secondary | ICD-10-CM | POA: Diagnosis not present

## 2015-04-13 DIAGNOSIS — N183 Chronic kidney disease, stage 3 unspecified: Secondary | ICD-10-CM | POA: Diagnosis present

## 2015-04-13 DIAGNOSIS — I129 Hypertensive chronic kidney disease with stage 1 through stage 4 chronic kidney disease, or unspecified chronic kidney disease: Secondary | ICD-10-CM | POA: Diagnosis present

## 2015-04-13 DIAGNOSIS — D696 Thrombocytopenia, unspecified: Secondary | ICD-10-CM | POA: Diagnosis present

## 2015-04-13 DIAGNOSIS — R627 Adult failure to thrive: Secondary | ICD-10-CM | POA: Diagnosis present

## 2015-04-13 DIAGNOSIS — R339 Retention of urine, unspecified: Secondary | ICD-10-CM | POA: Diagnosis not present

## 2015-04-13 DIAGNOSIS — Z79899 Other long term (current) drug therapy: Secondary | ICD-10-CM

## 2015-04-13 DIAGNOSIS — E1122 Type 2 diabetes mellitus with diabetic chronic kidney disease: Secondary | ICD-10-CM | POA: Diagnosis present

## 2015-04-13 DIAGNOSIS — I10 Essential (primary) hypertension: Secondary | ICD-10-CM | POA: Diagnosis present

## 2015-04-13 DIAGNOSIS — H9190 Unspecified hearing loss, unspecified ear: Secondary | ICD-10-CM | POA: Diagnosis present

## 2015-04-13 DIAGNOSIS — E43 Unspecified severe protein-calorie malnutrition: Secondary | ICD-10-CM | POA: Diagnosis present

## 2015-04-13 DIAGNOSIS — E785 Hyperlipidemia, unspecified: Secondary | ICD-10-CM | POA: Diagnosis present

## 2015-04-13 DIAGNOSIS — Z6821 Body mass index (BMI) 21.0-21.9, adult: Secondary | ICD-10-CM

## 2015-04-13 DIAGNOSIS — R001 Bradycardia, unspecified: Secondary | ICD-10-CM | POA: Diagnosis present

## 2015-04-13 DIAGNOSIS — E876 Hypokalemia: Secondary | ICD-10-CM | POA: Diagnosis present

## 2015-04-13 DIAGNOSIS — K222 Esophageal obstruction: Secondary | ICD-10-CM | POA: Diagnosis not present

## 2015-04-13 DIAGNOSIS — R630 Anorexia: Secondary | ICD-10-CM | POA: Diagnosis present

## 2015-04-13 DIAGNOSIS — R64 Cachexia: Secondary | ICD-10-CM | POA: Diagnosis present

## 2015-04-13 DIAGNOSIS — Z792 Long term (current) use of antibiotics: Secondary | ICD-10-CM

## 2015-04-13 DIAGNOSIS — E119 Type 2 diabetes mellitus without complications: Secondary | ICD-10-CM

## 2015-04-13 DIAGNOSIS — E860 Dehydration: Secondary | ICD-10-CM | POA: Diagnosis present

## 2015-04-13 DIAGNOSIS — I951 Orthostatic hypotension: Secondary | ICD-10-CM | POA: Diagnosis present

## 2015-04-13 DIAGNOSIS — Z87891 Personal history of nicotine dependence: Secondary | ICD-10-CM

## 2015-04-13 LAB — CBG MONITORING, ED: GLUCOSE-CAPILLARY: 130 mg/dL — AB (ref 65–99)

## 2015-04-13 LAB — BASIC METABOLIC PANEL
Anion gap: 10 (ref 5–15)
BUN: 30 mg/dL — ABNORMAL HIGH (ref 6–20)
CALCIUM: 9.2 mg/dL (ref 8.9–10.3)
CHLORIDE: 100 mmol/L — AB (ref 101–111)
CO2: 27 mmol/L (ref 22–32)
Creatinine, Ser: 1.5 mg/dL — ABNORMAL HIGH (ref 0.61–1.24)
GFR calc Af Amer: 48 mL/min — ABNORMAL LOW (ref >60)
GFR calc non Af Amer: 41 mL/min — ABNORMAL LOW (ref >60)
Glucose, Bld: 146 mg/dL — ABNORMAL HIGH (ref 65–99)
Potassium: 3.2 mmol/L — ABNORMAL LOW (ref 3.5–5.1)
SODIUM: 137 mmol/L (ref 135–145)

## 2015-04-13 LAB — CBC
HEMATOCRIT: 37.2 % — AB (ref 39.0–52.0)
HEMOGLOBIN: 13.3 g/dL (ref 13.0–17.0)
MCH: 31.2 pg (ref 26.0–34.0)
MCHC: 35.8 g/dL (ref 30.0–36.0)
MCV: 87.3 fL (ref 78.0–100.0)
Platelets: 141 10*3/uL — ABNORMAL LOW (ref 150–400)
RBC: 4.26 MIL/uL (ref 4.22–5.81)
RDW: 13.5 % (ref 11.5–15.5)
WBC: 9.5 10*3/uL (ref 4.0–10.5)

## 2015-04-13 MED ORDER — SODIUM CHLORIDE 0.9 % IV BOLUS (SEPSIS)
1000.0000 mL | Freq: Once | INTRAVENOUS | Status: AC
  Administered 2015-04-14: 1000 mL via INTRAVENOUS

## 2015-04-13 NOTE — ED Provider Notes (Signed)
CSN: [id number]   Arrival date & time 04/13/15 2236  History  This chart was scribed for  Mirian Mo, MD by Bethel Born, ED Scribe. This patient was seen in room B19C/B19C and the patient's care was started at 11:24 PM.  Chief Complaint  Patient presents with  . Failure To Thrive  . Weakness    HPI The history is provided by the patient and a relative. No language interpreter was used.   Tanner Owen is a 81 y.o. male with PMHx of esophageal stricture, GERD, diverticulitis, HTN, HLD, DM, and CKD who presents to the Emergency Department with his sons complaining of nausea and vomiting that worsened tonight. Associated symptoms include decreased oral intake for over 2 weeks and generalized weakness. Per sons, the pt is unable to tolerate any food at all and minimally tolerates liquids. The pt has had his esophagus stretched twice in the past by Dr. Marina Goodell and has chronic difficulty swallowing. He was seen in the ED at [facility] on 04/05/15 and discharged with Keflex for UTI but was only able to take the antibiotics for 3 days because they exacerbated his nausea. Pt is chronically constipated.  .   Past Medical History  Diagnosis Date  . B12 deficiency   . Hypertension   . Hyperlipidemia   . GERD (gastroesophageal reflux disease)   . Anemia   . Thrombocytopenia   . Microalbuminuria   . Renal artery stenosis   . Chronic kidney disease (CKD), stage III (moderate)   . Diabetes mellitus   . Diverticulitis   . Esophageal stricture   . Gout     Past Surgical History  Procedure Laterality Date  . Hernia repair      hernia surgery x2 approx.30 years ago  . Eye surgery      caratarct surgery on 11/29/11, left eye  . Gas/fluid exchange  01/05/2012    Procedure: GAS/FLUID EXCHANGE;  Surgeon: Sherrie George, MD;  Location: [facility] OR;  Service: Ophthalmology;  Laterality: Left;  . Pars plana vitrectomy  01/05/2012    Procedure: PARS PLANA VITRECTOMY WITH 25 GAUGE;  Surgeon: Sherrie George, MD;  Location: [facility] OR;  Service: Ophthalmology;  Laterality: Left;  . Placement and suture of secondary intraocular lens  01/05/2012    Procedure: PLACEMENT AND SUTURE OF SECONDARY INTRAOCULAR LENS;  Surgeon: Sherrie George, MD;  Location: [facility] OR;  Service: Ophthalmology;  Laterality: Left;  . Esophageal dilation      Family History  Problem Relation Age of Onset  . Stroke Mother     History  Substance Use Topics  . Smoking status: Former Smoker -- 0.25 packs/day for 50 years    Types: Cigarettes    Quit date: 01/05/1989  . Smokeless tobacco: Never Used     Comment: stopped smoking in 1990  . Alcohol Use: No     Comment: quit drinking in 1990     Review of Systems  Constitutional: Positive for appetite change. Negative for fever.  HENT: Positive for trouble swallowing.   Gastrointestinal: Positive for nausea, vomiting and constipation.  Neurological: Positive for weakness.  All other systems reviewed and are negative.    Home Medications   Prior to Admission medications   Medication Sig Start Date End Date Taking? Authorizing Provider  acetaminophen (TYLENOL) 500 MG tablet Take 500 mg by mouth every 6 (six) hours as needed for moderate pain or headache.    Historical Provider, MD  cephALEXin (KEFLEX) 500 MG capsule Take  1 capsule (500 mg total) by mouth 4 (four) times daily. 04/05/15   Roxy Horseman, PA-C  Omega-3 Fatty Acids (FISH OIL PO) Take 5 mLs by mouth daily.    Historical Provider, MD  omeprazole (PRILOSEC) 20 MG capsule TAKE TWO CAPSULES BY MOUTH DAILY Patient taking differently: TAKE ONE CAPSULES BY MOUTH DAILY 03/28/12   Hilarie Fredrickson, MD  propranolol (INDERAL) 80 MG tablet Take 40 mg by mouth 2 (two) times daily.  12/26/11   Historical Provider, MD    Allergies  Review of patient's allergies indicates no known allergies.  Triage Vitals: BP 238/86 mmHg  Pulse 57  Temp(Src) 97.5 F (36.4 C) (Oral)  Resp 17  SpO2 98%  Physical Exam  Constitutional:  He is oriented to person, place, and time. He appears well-developed and well-nourished.  HENT:  Head: Normocephalic and atraumatic.  Eyes: Conjunctivae and EOM are normal.  Neck: Normal range of motion. Neck supple.  Cardiovascular: Normal rate, regular rhythm and normal heart sounds.   Pulmonary/Chest: Effort normal and breath sounds normal. No respiratory distress.  Abdominal: He exhibits no distension. There is no tenderness. There is no rebound and no guarding.  Musculoskeletal: Normal range of motion.  Neurological: He is alert and oriented to person, place, and time.  Skin: Skin is warm and dry.  Vitals reviewed.   ED Course  Procedures   DIAGNOSTIC STUDIES: Oxygen Saturation is 98% on RA, normal by my interpretation.    COORDINATION OF CARE: 11:29 PM Discussed treatment plan which includes abdominal and chest XR, lab work, and EKG with pt at bedside and pt agreed to plan.  1:18 AM-Consult complete with Dr. Lovell Sheehan. Patient case explained and discussed. [facility] agrees to admit patient for further evaluation and treatment. Call ended at 1:19 AM   Labs Review-  Labs Reviewed  BASIC METABOLIC PANEL - Abnormal; Notable for the following:    Potassium 3.2 (*)    Chloride 100 (*)    Glucose, Bld 146 (*)    BUN 30 (*)    Creatinine, Ser 1.50 (*)    GFR calc non Af Amer 41 (*)    GFR calc Af Amer 48 (*)    All other components within normal limits  CBC - Abnormal; Notable for the following:    HCT 37.2 (*)    Platelets 141 (*)    All other components within normal limits  URINALYSIS, ROUTINE W REFLEX MICROSCOPIC (NOT AT [facility]) - Abnormal; Notable for the following:    Color, Urine AMBER (*)    Bilirubin Urine MODERATE (*)    Ketones, ur 15 (*)    Protein, ur >300 (*)    Urobilinogen, UA 2.0 (*)    All other components within normal limits  URINE MICROSCOPIC-ADD ON - Abnormal; Notable for the following:    Casts HYALINE CASTS (*)    All other components within normal  limits  CBG MONITORING, ED - Abnormal; Notable for the following:    Glucose-Capillary 130 (*)    All other components within normal limits    Imaging Review Dg Abd Acute W/chest  04/14/2015   CLINICAL DATA:  Abdominal pain, vomiting.  Malaise.  EXAM: DG ABDOMEN ACUTE W/ 1V CHEST  COMPARISON:  Chest radiograph 04/05/2015  FINDINGS: The cardiomediastinal contours are unchanged. The lungs are clear. There is no free intra-abdominal air. No dilated bowel loops to suggest obstruction. Moderate volume of stool throughout the colon, with stool ball distending the rectum. No radiopaque calculi. Vascular calcifications  of the splenic artery. No acute osseous abnormalities are seen.  IMPRESSION: 1. Moderate volume of colonic stool with stool ball distending the rectum, can be seen with constipation. No bowel obstruction. 2.  No acute pulmonary process.   Electronically Signed   By: Rubye Oaks M.D.   On: 04/14/2015 00:13    EKG Interpretation  Date/Time:  Saturday April 13 2015 22:49:22 EDT Ventricular Rate:  54 PR Interval:  169 QRS Duration: 101 QT Interval:  438 QTC Calculation: 415 R Axis:   -44 Text Interpretation:  Age not entered, assumed to be  81 years old for purpose of ECG interpretation Sinus rhythm LVH with secondary repolarization abnormality Inferior infarct, old Baseline wander in lead(s) II III aVF V4  rate has decreased since last tracing Confirmed by Mirian Mo [phone]) on 04/13/2015 11:02:44 PM       MDM   Final diagnoses:  None     81 y.o. male with pertinent PMH of erosive esophagitis sp multiple dilatations in the past presents with n/v, PO intolerance over the last weeks.  He has become gradually weak over this time. No fever, frank numbness, neurodeficits. He was seen recently, diagnosed with UTI and sent home with anti-biotics. On arrival today the patient has vital signs and physical exam as above. Orthostatics were sig positive despite 1 L fluid. Given  hydralazine for systolic blood pressure 238. This had vast improvement. Patient admitted for IV fluids..    I have reviewed all laboratory and imaging studies if ordered as above  No diagnosis found.       Mirian Mo, MD 04/14/15 [phone]

## 2015-04-13 NOTE — ED Notes (Signed)
CHECKED CBG 130

## 2015-04-13 NOTE — ED Notes (Signed)
Patient transported to X-ray 

## 2015-04-13 NOTE — ED Notes (Signed)
Per [facility], pt from home, recently treated for UTI. Given antibiotics a week ago and sent home. Pt had decreased oral intact for the last week, has stoped taking antibiotics because they made him feel nauseated. Pt also has hx of difficulty swallowing which is patients normal. Pt called EMS due to weakness, been feeling weak the last week. Pt states hes having a hard time walking and standing up at home. Pt positive orthostaic BP. Flat 202/98, HR 56, sitting 160/110 HR 68, standing 112/62, HR 72. Hx of HTN, takes beta blocker. CBG 149. Pt is AAOX4, pt is hard of hearing.

## 2015-04-14 ENCOUNTER — Encounter (HOSPITAL_COMMUNITY): Payer: Self-pay | Admitting: *Deleted

## 2015-04-14 DIAGNOSIS — Z66 Do not resuscitate: Secondary | ICD-10-CM | POA: Diagnosis not present

## 2015-04-14 DIAGNOSIS — I129 Hypertensive chronic kidney disease with stage 1 through stage 4 chronic kidney disease, or unspecified chronic kidney disease: Secondary | ICD-10-CM | POA: Diagnosis present

## 2015-04-14 DIAGNOSIS — R64 Cachexia: Secondary | ICD-10-CM | POA: Diagnosis present

## 2015-04-14 DIAGNOSIS — R112 Nausea with vomiting, unspecified: Secondary | ICD-10-CM | POA: Diagnosis present

## 2015-04-14 DIAGNOSIS — R630 Anorexia: Secondary | ICD-10-CM | POA: Diagnosis not present

## 2015-04-14 DIAGNOSIS — E785 Hyperlipidemia, unspecified: Secondary | ICD-10-CM | POA: Diagnosis present

## 2015-04-14 DIAGNOSIS — K219 Gastro-esophageal reflux disease without esophagitis: Secondary | ICD-10-CM | POA: Diagnosis present

## 2015-04-14 DIAGNOSIS — H9190 Unspecified hearing loss, unspecified ear: Secondary | ICD-10-CM | POA: Diagnosis present

## 2015-04-14 DIAGNOSIS — N183 Chronic kidney disease, stage 3 unspecified: Secondary | ICD-10-CM | POA: Diagnosis present

## 2015-04-14 DIAGNOSIS — E860 Dehydration: Secondary | ICD-10-CM | POA: Diagnosis not present

## 2015-04-14 DIAGNOSIS — Z792 Long term (current) use of antibiotics: Secondary | ICD-10-CM | POA: Diagnosis not present

## 2015-04-14 DIAGNOSIS — I10 Essential (primary) hypertension: Secondary | ICD-10-CM | POA: Diagnosis present

## 2015-04-14 DIAGNOSIS — E876 Hypokalemia: Secondary | ICD-10-CM | POA: Diagnosis present

## 2015-04-14 DIAGNOSIS — K222 Esophageal obstruction: Secondary | ICD-10-CM | POA: Diagnosis present

## 2015-04-14 DIAGNOSIS — E119 Type 2 diabetes mellitus without complications: Secondary | ICD-10-CM | POA: Diagnosis not present

## 2015-04-14 DIAGNOSIS — E118 Type 2 diabetes mellitus with unspecified complications: Secondary | ICD-10-CM

## 2015-04-14 DIAGNOSIS — Z79899 Other long term (current) drug therapy: Secondary | ICD-10-CM | POA: Diagnosis not present

## 2015-04-14 DIAGNOSIS — Z7189 Other specified counseling: Secondary | ICD-10-CM | POA: Diagnosis not present

## 2015-04-14 DIAGNOSIS — R001 Bradycardia, unspecified: Secondary | ICD-10-CM | POA: Diagnosis present

## 2015-04-14 DIAGNOSIS — R627 Adult failure to thrive: Secondary | ICD-10-CM | POA: Diagnosis present

## 2015-04-14 DIAGNOSIS — D696 Thrombocytopenia, unspecified: Secondary | ICD-10-CM | POA: Diagnosis present

## 2015-04-14 DIAGNOSIS — T447X5A Adverse effect of beta-adrenoreceptor antagonists, initial encounter: Secondary | ICD-10-CM | POA: Diagnosis present

## 2015-04-14 DIAGNOSIS — Z87891 Personal history of nicotine dependence: Secondary | ICD-10-CM | POA: Diagnosis not present

## 2015-04-14 DIAGNOSIS — E1122 Type 2 diabetes mellitus with diabetic chronic kidney disease: Secondary | ICD-10-CM | POA: Diagnosis present

## 2015-04-14 DIAGNOSIS — E43 Unspecified severe protein-calorie malnutrition: Secondary | ICD-10-CM | POA: Diagnosis present

## 2015-04-14 DIAGNOSIS — Z6821 Body mass index (BMI) 21.0-21.9, adult: Secondary | ICD-10-CM | POA: Diagnosis not present

## 2015-04-14 DIAGNOSIS — Z789 Other specified health status: Secondary | ICD-10-CM | POA: Diagnosis not present

## 2015-04-14 DIAGNOSIS — R1314 Dysphagia, pharyngoesophageal phase: Secondary | ICD-10-CM | POA: Diagnosis not present

## 2015-04-14 DIAGNOSIS — R339 Retention of urine, unspecified: Secondary | ICD-10-CM | POA: Diagnosis not present

## 2015-04-14 DIAGNOSIS — I951 Orthostatic hypotension: Secondary | ICD-10-CM

## 2015-04-14 LAB — BASIC METABOLIC PANEL
Anion gap: 10 (ref 5–15)
BUN: 29 mg/dL — AB (ref 6–20)
CO2: 20 mmol/L — ABNORMAL LOW (ref 22–32)
CREATININE: 1.43 mg/dL — AB (ref 0.61–1.24)
Calcium: 8.7 mg/dL — ABNORMAL LOW (ref 8.9–10.3)
Chloride: 110 mmol/L (ref 101–111)
GFR, EST AFRICAN AMERICAN: 51 mL/min — AB (ref >60)
GFR, EST NON AFRICAN AMERICAN: 44 mL/min — AB (ref >60)
Glucose, Bld: 151 mg/dL — ABNORMAL HIGH (ref 65–99)
Potassium: 3.4 mmol/L — ABNORMAL LOW (ref 3.5–5.1)
Sodium: 140 mmol/L (ref 135–145)

## 2015-04-14 LAB — GLUCOSE, CAPILLARY
GLUCOSE-CAPILLARY: 126 mg/dL — AB (ref 65–99)
GLUCOSE-CAPILLARY: 141 mg/dL — AB (ref 65–99)
GLUCOSE-CAPILLARY: 125 mg/dL — AB (ref 65–99)
GLUCOSE-CAPILLARY: 92 mg/dL (ref 65–99)
Glucose-Capillary: 94 mg/dL (ref 65–99)

## 2015-04-14 LAB — URINE MICROSCOPIC-ADD ON

## 2015-04-14 LAB — CBC
HCT: 36.1 % — ABNORMAL LOW (ref 39.0–52.0)
Hemoglobin: 12.6 g/dL — ABNORMAL LOW (ref 13.0–17.0)
MCH: 30.4 pg (ref 26.0–34.0)
MCHC: 34.9 g/dL (ref 30.0–36.0)
MCV: 87.2 fL (ref 78.0–100.0)
PLATELETS: 189 10*3/uL (ref 150–400)
RBC: 4.14 MIL/uL — AB (ref 4.22–5.81)
RDW: 13.6 % (ref 11.5–15.5)
WBC: 12 10*3/uL — AB (ref 4.0–10.5)

## 2015-04-14 LAB — URINALYSIS, ROUTINE W REFLEX MICROSCOPIC
Glucose, UA: NEGATIVE mg/dL
Hgb urine dipstick: NEGATIVE
Ketones, ur: 15 mg/dL — AB
Leukocytes, UA: NEGATIVE
NITRITE: NEGATIVE
SPECIFIC GRAVITY, URINE: 1.024 (ref 1.005–1.030)
UROBILINOGEN UA: 2 mg/dL — AB (ref 0.0–1.0)
pH: 6 (ref 5.0–8.0)

## 2015-04-14 MED ORDER — SODIUM CHLORIDE 0.9 % IV SOLN
INTRAVENOUS | Status: DC
  Administered 2015-04-14 – 2015-04-15 (×2): via INTRAVENOUS

## 2015-04-14 MED ORDER — SODIUM CHLORIDE 0.9 % IJ SOLN
3.0000 mL | Freq: Two times a day (BID) | INTRAMUSCULAR | Status: DC
  Administered 2015-04-15: 3 mL via INTRAVENOUS

## 2015-04-14 MED ORDER — ACETAMINOPHEN 650 MG RE SUPP: 650.0000 mg | Freq: Four times a day (QID) | RECTAL | Status: DC | PRN

## 2015-04-14 MED ORDER — ONDANSETRON HCL 4 MG PO TABS: 4.0000 mg | ORAL_TABLET | Freq: Four times a day (QID) | ORAL | Status: DC | PRN

## 2015-04-14 MED ORDER — ACETAMINOPHEN 325 MG PO TABS: 650.0000 mg | ORAL_TABLET | Freq: Four times a day (QID) | ORAL | Status: DC | PRN

## 2015-04-14 MED ORDER — ALUM & MAG HYDROXIDE-SIMETH 200-200-20 MG/5ML PO SUSP: 30.0000 mL | Freq: Four times a day (QID) | ORAL | Status: DC | PRN

## 2015-04-14 MED ORDER — ONDANSETRON HCL 4 MG/2ML IJ SOLN
4.0000 mg | Freq: Four times a day (QID) | INTRAMUSCULAR | Status: DC | PRN
  Administered 2015-04-14 (×2): 4 mg via INTRAVENOUS
  Filled 2015-04-14 (×2): qty 2

## 2015-04-14 MED ORDER — INSULIN ASPART 100 UNIT/ML ~~LOC~~ SOLN
0.0000 [IU] | SUBCUTANEOUS | Status: DC
  Administered 2015-04-14 (×2): 1 [IU] via SUBCUTANEOUS

## 2015-04-14 MED ORDER — HYDRALAZINE HCL 20 MG/ML IJ SOLN
10.0000 mg | Freq: Once | INTRAMUSCULAR | Status: AC
  Administered 2015-04-14: 10 mg via INTRAVENOUS
  Filled 2015-04-14: qty 1

## 2015-04-14 MED ORDER — HYDRALAZINE HCL 20 MG/ML IJ SOLN
10.0000 mg | Freq: Four times a day (QID) | INTRAMUSCULAR | Status: DC | PRN
  Administered 2015-04-14 – 2015-04-16 (×4): 10 mg via INTRAVENOUS
  Filled 2015-04-14 (×2): qty 1

## 2015-04-14 MED ORDER — POTASSIUM CHLORIDE 10 MEQ/100ML IV SOLN
10.0000 meq | INTRAVENOUS | Status: AC
  Administered 2015-04-14 (×3): 10 meq via INTRAVENOUS
  Filled 2015-04-14 (×3): qty 100

## 2015-04-14 MED ORDER — PANTOPRAZOLE SODIUM 40 MG IV SOLR
40.0000 mg | Freq: Two times a day (BID) | INTRAVENOUS | Status: DC
  Administered 2015-04-14 – 2015-04-15 (×4): 40 mg via INTRAVENOUS
  Filled 2015-04-14 (×4): qty 40

## 2015-04-14 MED ORDER — HYDROMORPHONE HCL 1 MG/ML IJ SOLN: 0.5000 mg | INTRAMUSCULAR | Status: DC | PRN

## 2015-04-14 MED ORDER — PROPRANOLOL HCL 40 MG PO TABS
40.0000 mg | ORAL_TABLET | Freq: Two times a day (BID) | ORAL | Status: DC
  Administered 2015-04-14 (×2): 40 mg via ORAL
  Filled 2015-04-14 (×5): qty 1

## 2015-04-14 MED ORDER — DOCUSATE SODIUM 100 MG PO CAPS
100.0000 mg | ORAL_CAPSULE | Freq: Every day | ORAL | Status: DC
  Administered 2015-04-14 – 2015-04-15 (×2): 100 mg via ORAL
  Filled 2015-04-14 (×2): qty 1

## 2015-04-14 MED ORDER — SODIUM CHLORIDE 0.9 % IV SOLN: INTRAVENOUS | Status: DC

## 2015-04-14 NOTE — H&P (Signed)
[facility] Admission History and Physical       CECIL VANDYKE ZOX:[id number] DOB: Dec 02, 1928 DOA: 04/13/2015  Referring physician: EDP PCP: Ezequiel Kayser, MD  Specialists:   Chief Complaint: Nausea and Vomiting   HPI: Tanner Owen is a 81 y.o. male with a history of HTN, DM2, Hyperlipidemia, Stage III CKD, RAS, and history of Esophageal Stricture S/P Dilatitions in the past who presents to the Ed with complaints of Nausea and vomiting and ABD Pain x 10 days and being unable to hold down foods and liquids.  He has had poor intake of foods and liquids over the past 3 weeks according to his son.   In the ED, he was found to be orthostatic, as well as having and increase in his BUN/Cr.   He was referred for admission.      Review of Systems:  Constitutional: No Weight Loss, No Weight Gain, Night Sweats, Fevers, Chills, Dizziness, Light Headedness, Fatigue, or Generalized Weakness HEENT: No Headaches, Difficulty Swallowing,Tooth/Dental Problems,Sore Throat,  No Sneezing, Rhinitis, Ear Ache, Nasal Congestion, or Post Nasal Drip,  Cardio-vascular:  No Chest pain, Orthopnea, PND, Edema in Lower Extremities, Anasarca, Dizziness, Palpitations  Resp: No Dyspnea, No DOE, No Productive Cough, No Non-Productive Cough, No Hemoptysis, No Wheezing.    GI: No Heartburn, Indigestion, +Abdominal Pain, Nausea, Vomiting, Diarrhea, Constipation, Hematemesis, Hematochezia, Melena, Change in Bowel Habits,  Loss of Appetite  GU: No Dysuria, No Change in Color of Urine, No Urgency or Urinary Frequency, No Flank pain.  Musculoskeletal: No Joint Pain or Swelling, No Decreased Range of Motion, No Back Pain.  Neurologic: No Syncope, No Seizures, Muscle Weakness, Paresthesia, Vision Disturbance or Loss, No Diplopia, No Vertigo, No Difficulty Walking,  Skin: No Rash or Lesions. Psych: No Change in Mood or Affect, No Depression or Anxiety, No Memory loss, No Confusion, or Hallucinations   Past Medical  History  Diagnosis Date  . B12 deficiency   . Hypertension   . Hyperlipidemia   . GERD (gastroesophageal reflux disease)   . Anemia   . Thrombocytopenia   . Microalbuminuria   . Renal artery stenosis   . Chronic kidney disease (CKD), stage III (moderate)   . Diabetes mellitus   . Diverticulitis   . Esophageal stricture   . Gout      Past Surgical History  Procedure Laterality Date  . Hernia repair      hernia surgery x2 approx.30 years ago  . Eye surgery      caratarct surgery on 11/29/11, left eye  . Gas/fluid exchange  01/05/2012    Procedure: GAS/FLUID EXCHANGE;  Surgeon: Sherrie George, MD;  Location: [facility] OR;  Service: Ophthalmology;  Laterality: Left;  . Pars plana vitrectomy  01/05/2012    Procedure: PARS PLANA VITRECTOMY WITH 25 GAUGE;  Surgeon: Sherrie George, MD;  Location: [facility] OR;  Service: Ophthalmology;  Laterality: Left;  . Placement and suture of secondary intraocular lens  01/05/2012    Procedure: PLACEMENT AND SUTURE OF SECONDARY INTRAOCULAR LENS;  Surgeon: Sherrie George, MD;  Location: [facility] OR;  Service: Ophthalmology;  Laterality: Left;  . Esophageal dilation        Prior to Admission medications   Medication Sig Start Date End Date Taking? Authorizing Provider  acetaminophen (TYLENOL) 500 MG tablet Take 500 mg by mouth every 6 (six) hours as needed for moderate pain or headache.    Historical Provider, MD  cephALEXin (KEFLEX) 500 MG capsule Take 1 capsule (500 mg total)  by mouth 4 (four) times daily. 04/05/15   Roxy Horseman, PA-C  Omega-3 Fatty Acids (FISH OIL PO) Take 5 mLs by mouth daily.    Historical Provider, MD  omeprazole (PRILOSEC) 20 MG capsule TAKE TWO CAPSULES BY MOUTH DAILY Patient taking differently: TAKE ONE CAPSULES BY MOUTH DAILY 03/28/12   Hilarie Fredrickson, MD  propranolol (INDERAL) 80 MG tablet Take 40 mg by mouth 2 (two) times daily.  12/26/11   Historical Provider, MD     No Known Allergies   Social History:  reports that he quit smoking  about 26 years ago. His smoking use included Cigarettes. He has a 12.5 pack-year smoking history. He has never used smokeless tobacco. He reports that he does not drink alcohol or use illicit drugs.     Family History  Problem Relation Age of Onset  . Stroke Mother        Physical Exam:  GEN:  Pleasant Elderly Cachectic 81 y.o. Caucasian male examined and in no acute distress; cooperative with exam Filed Vitals:   04/14/15 0026 04/14/15 0051 04/14/15 0053 04/14/15 0054  BP: 231/72 180/61 173/82 153/68  Pulse: 56 61 64 68  Temp:      TempSrc:      Resp: 14 16    SpO2: 99% 99%     Blood pressure 153/68, pulse 68, temperature 97.5 F (36.4 C), temperature source Oral, resp. rate 16, SpO2 99 %. PSYCH: He is alert and oriented x4; does not appear anxious does not appear depressed; affect is normal HEENT: Normocephalic and Atraumatic, Mucous membranes pink; PERRLA; EOM intact; Fundi:  Benign;  No scleral icterus, Nares: Patent, Oropharynx: Clear, Edentulous with Denture Present,    Neck:  FROM, No Cervical Lymphadenopathy nor Thyromegaly or Carotid Bruit; No JVD; Breasts:: Not examined CHEST WALL: No tenderness CHEST: Normal respiration, clear to auscultation bilaterally HEART: Regular rate and rhythm; no murmurs rubs or gallops BACK: No kyphosis or scoliosis; No CVA tenderness ABDOMEN: Positive Bowel Sounds, Scaphoid, Soft Non-Tender, No Rebound or Guarding; No Masses, No Organomegaly Rectal Exam: Not done EXTREMITIES: No  Cyanosis, Clubbing, or Edema; No Ulcerations. Genitalia: not examined PULSES: 2+ and symmetric SKIN: Normal hydration no rash or ulceration CNS:  Alert and Oriented x 4, No Focal Deficits Vascular: pulses palpable throughout    Labs on Admission:  Basic Metabolic Panel:  Recent Labs Lab 04/13/15 2249  NA 137  K 3.2*  CL 100*  CO2 27  GLUCOSE 146*  BUN 30*  CREATININE 1.50*  CALCIUM 9.2   Liver Function Tests: No results for input(s): AST, ALT,  ALKPHOS, BILITOT, PROT, ALBUMIN in the last 168 hours. No results for input(s): LIPASE, AMYLASE in the last 168 hours. No results for input(s): AMMONIA in the last 168 hours. CBC:  Recent Labs Lab 04/13/15 2249  WBC 9.5  HGB 13.3  HCT 37.2*  MCV 87.3  PLT 141*   Cardiac Enzymes: No results for input(s): CKTOTAL, CKMB, CKMBINDEX, TROPONINI in the last 168 hours.  BNP (last 3 results) No results for input(s): BNP in the last 8760 hours.  ProBNP (last 3 results) No results for input(s): PROBNP in the last 8760 hours.  CBG:  Recent Labs Lab 04/13/15 2325  GLUCAP 130*    Radiological Exams on Admission: Dg Abd Acute W/chest  04/14/2015   CLINICAL DATA:  Abdominal pain, vomiting.  Malaise.  EXAM: DG ABDOMEN ACUTE W/ 1V CHEST  COMPARISON:  Chest radiograph 04/05/2015  FINDINGS: The cardiomediastinal contours are unchanged. The  lungs are clear. There is no free intra-abdominal air. No dilated bowel loops to suggest obstruction. Moderate volume of stool throughout the colon, with stool ball distending the rectum. No radiopaque calculi. Vascular calcifications of the splenic artery. No acute osseous abnormalities are seen.  IMPRESSION: 1. Moderate volume of colonic stool with stool ball distending the rectum, can be seen with constipation. No bowel obstruction. 2.  No acute pulmonary process.   Electronically Signed   By: Rubye Oaks M.D.   On: 04/14/2015 00:13     EKG: Independently reviewed. Sinus Bradycardia rate =54 Old Inferior Infarct Changes   Assessment/Plan:   81 y.o. male with  Principal Problem:   1.    Dehydration   IVFs   Monitor BUN/Cr   Active Problems:   2.    Orthostasis   IVFs   Monitor Orthostatic Vitals     3.    Nausea and vomiting- Due to Esophageal Stx   PRN IV Zofran   Consult GI in AM    Sees Dr Marina Goodell       4.    ESOPHAGEAL STRICTURE- Hx of Previous Dilatations by Gi Dr Marina Goodell      5.    Hypokalemia   Replace K+   Check  Magnesium     6.    Chronic kidney disease (CKD), stage III (moderate)   Monitor BUN/Cr     7.    Diabetes mellitus   SSI coverage PRN   Check HbA1C     8.    Essential hypertension   PRN IV Hydralazine for SBP > 170    Continue Propanolol     9.    Thrombocytopenia   Monitor PLTs   10.     DVT Prophylaxis   SCDs       Code Status:     FULL CODE     Family Communication:   Son at Bedside      Disposition Plan:    Inpatient Status        Time spent:  60 Minutes      Ron Parker [facility] Pager [phone]   If 7AM -7PM Please Contact the Day Rounding Team MD for [facility]  If 7PM-7AM, Please Contact Night-Floor Coverage  [email or web address] Password [email or web address] 04/14/2015, 1:44 AM     ADDENDUM:   Patient was seen and examined on 04/14/2015

## 2015-04-14 NOTE — Progress Notes (Signed)
Called ED RN for report. Room ready.  

## 2015-04-14 NOTE — Progress Notes (Signed)
Patient seen and examined this morning, admitted overnight by Dr. Lovell Sheehan. This could this is a 81 year old gentleman with hypertension, diabetes, chronic kidney disease and history of esophageal strictures seen by gastroenterology, presented with acute on chronic poor by mouth intake, dysphagia as well as nausea. He was seen recently by Dr. Marina Goodell his gastroenterologist about a month ago, offered an EGD however patient wanted to see how he is doing on his own. He was admitted last night with dehydration and weakness in the setting of being unable to eat or drink appropriately for a week.   Esophageal strictures - I have consulted gastroenterology today, discussed with Dr. Russella Dar, appreciate input, it appears the patient will be getting an EGD tomorrow - Continue PPI  Dehydration with orthostatics - Continue hydration with IV fluids, repeat renal function with slight improvement in his creatinine   Chronic kidney disease stage III - His creatinine appears at baseline, continue to monitor  Diabetes mellitus - Hemoglobin A1c still pending - Continue sliding scale coverage  Hypokalemia - Likely in the setting of poor by mouth intake - Replete and monitor Hypertension - Continue propranolol  Thrombocytopenia - Acute on chronic, dating as far back as 2012 - No evidence of bleeding, continue to monitor  Srijan Givan M. Elvera Lennox, MD [facility] [phone]

## 2015-04-14 NOTE — Consult Note (Signed)
Referring Provider: [facility] Primary Care Physician:  Ezequiel Kayser, MD Primary Gastroenterologist:  Dr. Yancey Flemings  Reason for Consultation:  dysphagia , nausea, vomiting     HPI: Tanner Owen is a 81 y.o. male with a past medical history significant for stage III kidney disease, diabetes, diverticulitis, esophageal stricture, gout, thrombocytopenia, anemia, hyperlipidemia, and GERD. He is known to Dr. Marina Goodell with a history of GERD and severe erosive esophagitis with severe stricturing of the esophagus. He has been on chronic PPI therapy in the past and has required multiple endoscopies with dilation. His last endoscopy was in October 2011 at which time he was dilated with the balloon up to 17 mm. He was last evaluated by Dr. Marina Goodell in June of 2013 at which time he was scheduled for repeat endoscopy, however the patient canceled the procedure. Patient is accompanied by his son who sits at the bedside. Apparently patient began to complain of nausea, vomiting and abdominal pain one and a half to 2 weeks ago. He points to the epigastric area when describing his pain. He has been having increased difficulty swallowing foods and liquids and states everything comes back up. He came to the emergency room last night and was found to be dehydrated as well as orthostatic and was admitted.    Past Medical History  Diagnosis Date  . B12 deficiency   . Hypertension   . Hyperlipidemia   . GERD (gastroesophageal reflux disease)   . Anemia   . Thrombocytopenia   . Microalbuminuria   . Renal artery stenosis   . Chronic kidney disease (CKD), stage III (moderate)   . Diabetes mellitus   . Diverticulitis   . Esophageal stricture   . Gout     Past Surgical History  Procedure Laterality Date  . Hernia repair      hernia surgery x2 approx.30 years ago  . Eye surgery      caratarct surgery on 11/29/11, left eye  . Gas/fluid exchange  01/05/2012    Procedure: GAS/FLUID EXCHANGE;  Surgeon: Sherrie George, MD;  Location: [facility] OR;  Service: Ophthalmology;  Laterality: Left;  . Pars plana vitrectomy  01/05/2012    Procedure: PARS PLANA VITRECTOMY WITH 25 GAUGE;  Surgeon: Sherrie George, MD;  Location: [facility] OR;  Service: Ophthalmology;  Laterality: Left;  . Placement and suture of secondary intraocular lens  01/05/2012    Procedure: PLACEMENT AND SUTURE OF SECONDARY INTRAOCULAR LENS;  Surgeon: Sherrie George, MD;  Location: [facility] OR;  Service: Ophthalmology;  Laterality: Left;  . Esophageal dilation      Prior to Admission medications   Medication Sig Start Date End Date Taking? Authorizing Provider  acetaminophen (TYLENOL) 500 MG tablet Take 500 mg by mouth every 6 (six) hours as needed for moderate pain or headache.   Yes Historical Provider, MD  docusate sodium (COLACE) 100 MG capsule Take 100 mg by mouth daily.   Yes Historical Provider, MD  escitalopram (LEXAPRO) 10 MG tablet Take 10 mg by mouth daily.   Yes Historical Provider, MD  Omega-3 Fatty Acids (FISH OIL PO) Take 5 mLs by mouth daily.   Yes Historical Provider, MD  omeprazole (PRILOSEC) 20 MG capsule TAKE TWO CAPSULES BY MOUTH DAILY Patient taking differently: TAKE ONE CAPSULES BY MOUTH DAILY 03/28/12  Yes Hilarie Fredrickson, MD  propranolol (INDERAL) 80 MG tablet Take 40 mg by mouth 2 (two) times daily.  12/26/11  Yes Historical Provider, MD  cephALEXin (KEFLEX) 500 MG  capsule Take 1 capsule (500 mg total) by mouth 4 (four) times daily. Patient not taking: Reported on 04/14/2015 04/05/15   Roxy Horseman, PA-C    Current Facility-Administered Medications  Medication Dose Route Frequency Provider Last Rate Last Dose  . 0.9 %  sodium chloride infusion   Intravenous Continuous Ron Parker, MD 75 mL/hr at 04/14/15 0324    . acetaminophen (TYLENOL) tablet 650 mg  650 mg Oral Q6H PRN Ron Parker, MD       Or  . acetaminophen (TYLENOL) suppository 650 mg  650 mg Rectal Q6H PRN Ron Parker, MD      . alum & mag  hydroxide-simeth (MAALOX/MYLANTA) 200-200-20 MG/5ML suspension 30 mL  30 mL Oral Q6H PRN Ron Parker, MD      . docusate sodium (COLACE) capsule 100 mg  100 mg Oral Daily Harvette Velora Heckler, MD      . hydrALAZINE (APRESOLINE) injection 10 mg  10 mg Intravenous Q6H PRN Ron Parker, MD   10 mg at 04/14/15 0159  . HYDROmorphone (DILAUDID) injection 0.5-1 mg  0.5-1 mg Intravenous Q3H PRN Ron Parker, MD      . insulin aspart (novoLOG) injection 0-9 Units  0-9 Units Subcutaneous 6 times per day Ron Parker, MD   1 Units at 04/14/15 0815  . ondansetron (ZOFRAN) tablet 4 mg  4 mg Oral Q6H PRN Ron Parker, MD       Or  . ondansetron (ZOFRAN) injection 4 mg  4 mg Intravenous Q6H PRN Ron Parker, MD   4 mg at 04/14/15 0249  . propranolol (INDERAL) tablet 40 mg  40 mg Oral BID Ron Parker, MD      . sodium chloride 0.9 % injection 3 mL  3 mL Intravenous Q12H Ron Parker, MD   3 mL at 04/14/15 0325    Allergies as of 04/13/2015  . (No Known Allergies)    Family History  Problem Relation Age of Onset  . Stroke Mother     History   Social History  . Marital Status: Divorced    Spouse Name: N/A  . Number of Children: 3  . Years of Education: N/A   Occupational History  . Retired    Social History Main Topics  . Smoking status: Former Smoker -- 0.25 packs/day for 50 years    Types: Cigarettes    Quit date: 01/05/1989  . Smokeless tobacco: Never Used     Comment: stopped smoking in 1990  . Alcohol Use: No     Comment: quit drinking in 1990  . Drug Use: No  . Sexual Activity: Not on file   Other Topics Concern  . Not on file   Social History Narrative    Review of Systems: Gen: Denies any fever, chills, sweats, anorexia, fatigue, weakness, malaise, weight loss, and sleep disorder CV: Denies chest pain, angina, palpitations, syncope, orthopnea, PND, peripheral edema, and claudication. Resp: Denies dyspnea at rest, dyspnea with  exercise, cough, sputum, wheezing, coughing up blood, and pleurisy. GI: has had progressive dysphagia to solids and liquids for 6 months which is become severe over the last 2 weeks. Complains of epigastric abdominal pain.  GU : Denies urinary burning, blood in urine, urinary frequency, urinary hesitancy, nocturnal urination, and urinary incontinence. MS: Denies joint pain, limitation of movement, and swelling, stiffness, low back pain, extremity pain. Denies muscle weakness, cramps, atrophy.  Derm: Denies rash, itching, dry skin, hives, moles, warts, or  unhealing ulcers.  Psych: Denies depression, anxiety, memory loss, suicidal ideation, hallucinations, paranoia, and confusion. Heme: Denies bruising, bleeding, and enlarged lymph nodes. Neuro:  Denies any headaches, dizziness, paresthesias.  Physical Exam: Vital signs in last 24 hours: Temp:  [97.5 F (36.4 C)-98.4 F (36.9 C)] 98.4 F (36.9 C) (07/31 0812) Pulse Rate:  [55-87] 87 (07/31 0812) Resp:  [14-20] 20 (07/31 0812) BP: (133-238)/(60-86) 144/76 mmHg (07/31 0812) SpO2:  [97 %-99 %] 98 % (07/31 0812) Weight:  [133 lb 4.8 oz (60.464 kg)] 133 lb 4.8 oz (60.464 kg) (07/31 0335) Last BM Date: 04/13/15 General:   sleeping, easily roused,  thin, elderly white male pleasant and cooperative in NAD Head:  Normocephalic and atraumatic. Eyes:  Sclera clear, no icterus.   Conjunctiva pink. Ears:  Normal auditory acuity. Nose:  No deformity, discharge,  or lesions. Mouth:  No deformity or lesions.   edentulous, has dentures  Neck:  Supple; no masses or thyromegaly. Lungs:  Clear throughout to auscultation.    Heart:  Regular rate and rhythm Abdomen:  Soft, mild epigastric tenderness to palpation but no rebound or guarding  BS active,nonpalp mass or hsm.   Rectal:  Deferred  Msk:  Symmetrical without gross deformities. . Pulses:  Normal pulses noted. Extremities:  Without clubbing or edema. Neurologic: Alert and  oriented x4;  grossly  normal neurologically. Skin:  Intact without significant lesions or rashes.. Psych: Alert and cooperative. Normal mood and affect.  Intake/Output from previous day: 07/30 0701 - 07/31 0700 In: 1555 [P.O.:60; I.V.:1195; IV Piggyback:300] Out: 200 [Urine:200] Intake/Output this shift:    Lab Results:  Recent Labs  04/13/15 2249 04/14/15 0434  WBC 9.5 12.0*  HGB 13.3 12.6*  HCT 37.2* 36.1*  PLT 141* 189   BMET  Recent Labs  04/13/15 2249 04/14/15 0434  NA 137 140  K 3.2* 3.4*  CL 100* 110  CO2 27 20*  GLUCOSE 146* 151*  BUN 30* 29*  CREATININE 1.50* 1.43*  CALCIUM 9.2 8.7*    Studies/Results: Dg Abd Acute W/chest  04/14/2015   CLINICAL DATA:  Abdominal pain, vomiting.  Malaise.  EXAM: DG ABDOMEN ACUTE W/ 1V CHEST  COMPARISON:  Chest radiograph 04/05/2015  FINDINGS: The cardiomediastinal contours are unchanged. The lungs are clear. There is no free intra-abdominal air. No dilated bowel loops to suggest obstruction. Moderate volume of stool throughout the colon, with stool ball distending the rectum. No radiopaque calculi. Vascular calcifications of the splenic artery. No acute osseous abnormalities are seen.  IMPRESSION: 1. Moderate volume of colonic stool with stool ball distending the rectum, can be seen with constipation. No bowel obstruction. 2.  No acute pulmonary process.   Electronically Signed   By: Rubye Oaks M.D.   On: 04/14/2015 00:13    IMPRESSION/PLAN:  #1. Dysphagia, nausea, vomiting. Likely due to recurrent esophagitis and esophageal stricture. Continue IV fluids. PPI. Antiemetics. Will plan on EGD with possible dilation tomorrow morning. #2. Dehydration. IV fluid. Orthostasis was likely related to his dehydration.    Hvozdovic, Moise Boring 04/14/2015,  Pager [phone]     Attending physician's note   I have taken a history, examined the patient and reviewed the chart. I agree with the Advanced Practitioner's note, impression and recommendations.  Worsening dysphagia, epigastric pain, N/V and dehydration. Suspect esophagitis and a recurrent esophageal stricture. R/O esophagitis, ulcer, neoplasm, etc. EGD with possible dilation tomorrow. IV fluids. IV PPI bid for now. IV antiemetics. Clear liquids only.   Meryl Dare,  MD Clementeen Graham [phone] pager Mon-Fri 8a-5p [phone] weekends, holidays and 5p-8a or per Amion

## 2015-04-14 NOTE — Progress Notes (Signed)
Utilization Review Completed.Tanner Owen T7/31/2016  

## 2015-04-15 ENCOUNTER — Inpatient Hospital Stay (HOSPITAL_COMMUNITY): Payer: Medicare Other | Admitting: Anesthesiology

## 2015-04-15 ENCOUNTER — Encounter (HOSPITAL_COMMUNITY): Payer: Self-pay | Admitting: *Deleted

## 2015-04-15 ENCOUNTER — Encounter (HOSPITAL_COMMUNITY)
Admission: EM | Disposition: A | Discharge status: Hospice | Payer: Medicare Other | Source: Home / Self Care | Attending: Internal Medicine

## 2015-04-15 LAB — GLUCOSE, CAPILLARY
GLUCOSE-CAPILLARY: 105 mg/dL — AB (ref 65–99)
GLUCOSE-CAPILLARY: 86 mg/dL (ref 65–99)
GLUCOSE-CAPILLARY: 88 mg/dL (ref 65–99)
Glucose-Capillary: 85 mg/dL (ref 65–99)
Glucose-Capillary: 81 mg/dL (ref 65–99)
Glucose-Capillary: 87 mg/dL (ref 65–99)

## 2015-04-15 LAB — CBC
HEMATOCRIT: 28.1 % — AB (ref 39.0–52.0)
Hemoglobin: 9.9 g/dL — ABNORMAL LOW (ref 13.0–17.0)
MCH: 30.9 pg (ref 26.0–34.0)
MCHC: 35.2 g/dL (ref 30.0–36.0)
MCV: 87.8 fL (ref 78.0–100.0)
Platelets: 104 10*3/uL — ABNORMAL LOW (ref 150–400)
RBC: 3.2 MIL/uL — AB (ref 4.22–5.81)
RDW: 13.9 % (ref 11.5–15.5)
WBC: 6 10*3/uL (ref 4.0–10.5)

## 2015-04-15 LAB — BASIC METABOLIC PANEL
ANION GAP: 5 (ref 5–15)
BUN: 28 mg/dL — AB (ref 6–20)
CO2: 23 mmol/L (ref 22–32)
CREATININE: 1.46 mg/dL — AB (ref 0.61–1.24)
Calcium: 8.4 mg/dL — ABNORMAL LOW (ref 8.9–10.3)
Chloride: 113 mmol/L — ABNORMAL HIGH (ref 101–111)
GFR calc Af Amer: 49 mL/min — ABNORMAL LOW (ref >60)
GFR, EST NON AFRICAN AMERICAN: 43 mL/min — AB (ref >60)
Glucose, Bld: 90 mg/dL (ref 65–99)
Potassium: 3 mmol/L — ABNORMAL LOW (ref 3.5–5.1)
SODIUM: 141 mmol/L (ref 135–145)

## 2015-04-15 LAB — HEMOGLOBIN A1C
Hgb A1c MFr Bld: 6.8 % — ABNORMAL HIGH (ref 4.8–5.6)
MEAN PLASMA GLUCOSE: 148 mg/dL

## 2015-04-15 SURGERY — CANCELLED PROCEDURE
Anesthesia: Monitor Anesthesia Care

## 2015-04-15 MED ORDER — POTASSIUM CHLORIDE 2 MEQ/ML IV SOLN
INTRAVENOUS | Status: DC
Start: 2015-04-15 — End: 2015-04-16
  Administered 2015-04-15: 17:00:00 via INTRAVENOUS
  Filled 2015-04-15 (×2): qty 1000

## 2015-04-15 MED ORDER — CLONIDINE HCL 0.2 MG PO TABS
0.2000 mg | ORAL_TABLET | Freq: Once | ORAL | Status: AC
  Administered 2015-04-15: 0.2 mg via ORAL
  Filled 2015-04-15: qty 1

## 2015-04-15 MED ORDER — AMLODIPINE BESYLATE 5 MG PO TABS
5.0000 mg | ORAL_TABLET | Freq: Every day | ORAL | Status: DC
  Administered 2015-04-15: 5 mg via ORAL
  Filled 2015-04-15: qty 1

## 2015-04-15 MED ORDER — HYDRALAZINE HCL 20 MG/ML IJ SOLN
INTRAMUSCULAR | Status: AC
  Filled 2015-04-15: qty 1

## 2015-04-15 MED ORDER — PROPRANOLOL HCL 10 MG PO TABS
10.0000 mg | ORAL_TABLET | Freq: Two times a day (BID) | ORAL | Status: DC
  Administered 2015-04-15 (×2): 10 mg via ORAL
  Filled 2015-04-15 (×5): qty 1

## 2015-04-15 MED ORDER — HYDRALAZINE HCL 10 MG PO TABS
10.0000 mg | ORAL_TABLET | Freq: Three times a day (TID) | ORAL | Status: DC
  Administered 2015-04-15: 10 mg via ORAL
  Filled 2015-04-15 (×2): qty 1

## 2015-04-15 MED ORDER — HYDRALAZINE HCL 20 MG/ML IJ SOLN: 10.0000 mg | INTRAMUSCULAR | Status: DC | PRN

## 2015-04-15 MED ORDER — SODIUM CHLORIDE 0.9 % IV SOLN
INTRAVENOUS | Status: DC
  Administered 2015-04-15: 500 mL via INTRAVENOUS

## 2015-04-15 MED ORDER — POTASSIUM CHLORIDE CRYS ER 20 MEQ PO TBCR
40.0000 meq | EXTENDED_RELEASE_TABLET | ORAL | Status: DC
Start: 2015-04-15 — End: 2015-04-15
  Filled 2015-04-15: qty 2

## 2015-04-15 NOTE — OR Nursing (Signed)
Procedure cancelled per anesthesia due to high systolic blood pressure and decrease in heart rate.  Patient given prn hydralazine.  Floor nurse notified and patient taken back to room.

## 2015-04-15 NOTE — Progress Notes (Signed)
In pre-op for EGD he was found to have severe HTN (257/71) and HR 46.  Unclear etiology but not safe to sedate for this non emergent procedure.  Will send back to floor for medical management of HTN, bradycardia. Will follow along.  Keep on clears for now, PPI BID IV.

## 2015-04-15 NOTE — Anesthesia Preprocedure Evaluation (Addendum)
Anesthesia Evaluation  Patient identified by MRN, date of birth, ID band Patient awake    Reviewed: Allergy & Precautions, H&P , NPO status , Patient's Chart, lab work & pertinent test results  Airway Mallampati: I  TM Distance: >3 FB Neck ROM: Full    Dental no notable dental hx. (+) Edentulous Upper, Edentulous Lower   Pulmonary former smoker,  breath sounds clear to auscultation  Pulmonary exam normal       Cardiovascular hypertension, On Medications and On Home Beta Blockers + Peripheral Vascular Disease Rhythm:Regular Rate:Normal     Neuro/Psych negative neurological ROS  negative psych ROS   GI/Hepatic Neg liver ROS, GERD-  ,  Endo/Other  diabetes, Type 2, Oral Hypoglycemic Agents  Renal/GU Renal diseasenegative Renal ROS  negative genitourinary   Musculoskeletal   Abdominal   Peds  Hematology negative hematology ROS (+) anemia ,   Anesthesia Other Findings   Reproductive/Obstetrics negative OB ROS                            Anesthesia Physical  Anesthesia Plan  ASA: III  Anesthesia Plan: MAC   Post-op Pain Management:    Induction: Intravenous  Airway Management Planned:   Additional Equipment:   Intra-op Plan:   Post-operative Plan:   Informed Consent: I have reviewed the patients History and Physical, chart, labs and discussed the procedure including the risks, benefits and alternatives for the proposed anesthesia with the patient or authorized representative who has indicated his/her understanding and acceptance.   Dental advisory given  Plan Discussed with: CRNA  Anesthesia Plan Comments:         Anesthesia Quick Evaluation

## 2015-04-15 NOTE — Progress Notes (Signed)
PROGRESS NOTE  Tanner Owen:[id number] DOB: Oct 16, 1928 DOA: 04/13/2015 PCP: Ezequiel Kayser, MD   HPI: this is a 81 year old gentleman with hypertension, diabetes, chronic kidney disease and history of esophageal strictures seen by gastroenterology, presented with acute on chronic poor by mouth intake, dysphagia as well as nausea. He was seen recently by Dr. Marina Goodell his gastroenterologist about a month ago, offered an EGD however patient wanted to see how he is doing on his own. He was admitted with dehydration and weakness in the setting of being unable to eat or drink appropriately for a week.   Subjective / 24 H Interval events - feeling well this morning - EGD cancelled due to BP in the 200s in the endo suite - Patient seen after his blood pressure was found to be elevated, denies any chest pain, denies any headaches, denies any blurry vision, denies any shortness of breath  Assessment/Plan: Principal Problem:   Dehydration Active Problems:   ESOPHAGEAL STRICTURE   Orthostasis   Nausea and vomiting   Hypokalemia   Chronic kidney disease (CKD), stage III (moderate)   Diabetes mellitus   Essential hypertension   Thrombocytopenia   Esophageal strictures  - Gastroenterology consulted, patient was supposed to get an EGD this morning however that was canceled due to elevated blood pressure  - Continue PPI  - Probably reasonable to reattempt tomorrow pending blood pressure control   Hypertensive urgency  - Patient this morning with blood pressures 238 systolic  - He is asymptomatic, no chest pain or headaches or blurry vision - Suspect he has a component of chronic elevated blood pressures and also in his previous office visits as well as his most recent ED visit his blood pressure was in the 170s systolic - Given bradycardia will decrease the propranolol to 10 mg twice a day from 40 mg twice a day, also add Norvasc 5 mg daily as well as hydralazine 10 mg 3 times daily -  Closely monitor blood pressure  Bradycardia - Likely due to propranolol, will decrease doses as above - monitor on telemetry  - asymptomatic  Dehydration with orthostatics - Continue hydration with IV fluids as he is not having good by mouth intake  Chronic kidney disease stage III - His creatinine appears at baseline, continue to monitor  Diabetes mellitus - Hemoglobin A1c still pending - Continue sliding scale coverage  Hypokalemia - Likely in the setting of poor by mouth intake - Replete and monitor Hypertension - Continue propranolol  Thrombocytopenia - Acute on chronic, dating as far back as 2012 - No evidence of bleeding, continue to monitor   Diet: Diet clear liquid Room service appropriate?: Yes; Fluid consistency:: Thin Fluids: NS 50 cc/h DVT Prophylaxis: SCD  Code Status: Full Code Family Communication: d/w son bedside  Disposition Plan: remain inpatient  Consultants:  GI  Procedures:  None    Antibiotics  Anti-infectives    None      Studies  Dg Abd Acute W/chest  04/14/2015   CLINICAL DATA:  Abdominal pain, vomiting.  Malaise.  EXAM: DG ABDOMEN ACUTE W/ 1V CHEST  COMPARISON:  Chest radiograph 04/05/2015  FINDINGS: The cardiomediastinal contours are unchanged. The lungs are clear. There is no free intra-abdominal air. No dilated bowel loops to suggest obstruction. Moderate volume of stool throughout the colon, with stool ball distending the rectum. No radiopaque calculi. Vascular calcifications of the splenic artery. No acute osseous abnormalities are seen.  IMPRESSION: 1. Moderate volume of colonic stool with stool ball  distending the rectum, can be seen with constipation. No bowel obstruction. 2.  No acute pulmonary process.   Electronically Signed   By: Rubye Oaks M.D.   On: 04/14/2015 00:13   Objective  Filed Vitals:   04/15/15 0811 04/15/15 0843 04/15/15 0912 04/15/15 1131  BP: 257/71 238/66 222/72 145/71  Pulse: 46  55 59  Temp:  97.9 F (36.6 C)  98 F (36.7 C)   TempSrc: Oral  Oral   Resp: 14  16   Height:      Weight:      SpO2: 97%  99%     Intake/Output Summary (Last 24 hours) at 04/15/15 1315 Last data filed at 04/15/15 1000  Gross per 24 hour  Intake    970 ml  Output    950 ml  Net     20 ml   Filed Weights   04/14/15 0335 04/14/15 2047  Weight: 60.464 kg (133 lb 4.8 oz) 62.143 kg (137 lb)   Exam:  General:  NAD, appears weak  HEENT: no scleral icterus, PERRL  Cardiovascular: RRR without MRG, 2+ peripheral pulses, no edema  Respiratory: CTA biL, good air movement, no wheezing, no crackles, no rales  Abdomen: soft, non tender, BS +, no guarding  MSK/Extremities: no clubbing/cyanosis, no joint swelling  Skin: no rashes  Neuro: non focal, strength 5/5 in all 4   Data Reviewed: Basic Metabolic Panel:  Recent Labs Lab 04/13/15 2249 04/14/15 0434 04/15/15 0455  NA 137 140 141  K 3.2* 3.4* 3.0*  CL 100* 110 113*  CO2 27 20* 23  GLUCOSE 146* 151* 90  BUN 30* 29* 28*  CREATININE 1.50* 1.43* 1.46*  CALCIUM 9.2 8.7* 8.4*   CBC:  Recent Labs Lab 04/13/15 2249 04/14/15 0434 04/15/15 0455  WBC 9.5 12.0* 6.0  HGB 13.3 12.6* 9.9*  HCT 37.2* 36.1* 28.1*  MCV 87.3 87.2 87.8  PLT 141* 189 104*   CBG:  Recent Labs Lab 04/14/15 2041 04/15/15 0012 04/15/15 0420 04/15/15 0733 04/15/15 1130  GLUCAP 94 86 85 81 87    Recent Results (from the past 240 hour(s))  Urine culture     Status: None   Collection Time: 04/05/15  9:10 PM  Result Value Ref Range Status   Specimen Description URINE, CLEAN CATCH  Final   Special Requests NONE  Final   Culture   Final    NO GROWTH 1 DAY Performed at [facility]    Report Status 04/07/2015 FINAL  Final   Scheduled Meds: . amLODipine  5 mg Oral Daily  . docusate sodium  100 mg Oral Daily  . hydrALAZINE  10 mg Oral 3 times per day  . insulin aspart  0-9 Units Subcutaneous 6 times per day  . pantoprazole (PROTONIX) IV  40  mg Intravenous Q12H  . propranolol  10 mg Oral BID  . sodium chloride  3 mL Intravenous Q12H   Continuous Infusions: . sodium chloride 75 mL/hr at 04/15/15 0954   Time spent: 25 minutes   Pamella Pert, MD [facility] Pager [phone]. If 7 PM - 7 AM, please contact night-coverage at [email or web address], password [facility] 04/15/2015, 1:15 PM  LOS: 1 day

## 2015-04-15 NOTE — Progress Notes (Signed)
Patient arrived from ENDO via stretcher. Son at bedside.  BP 222/72 HR 55. Patient complain of dizziness.  MD notified.  Will continue to monitor.

## 2015-04-16 ENCOUNTER — Encounter (HOSPITAL_COMMUNITY): Payer: Self-pay | Admitting: *Deleted

## 2015-04-16 ENCOUNTER — Inpatient Hospital Stay (HOSPITAL_COMMUNITY): Payer: Medicare Other | Admitting: Anesthesiology

## 2015-04-16 ENCOUNTER — Encounter (HOSPITAL_COMMUNITY)
Admission: EM | Disposition: A | Discharge status: Hospice | Payer: Self-pay | Source: Home / Self Care | Attending: Internal Medicine

## 2015-04-16 HISTORY — PX: ESOPHAGOGASTRODUODENOSCOPY (EGD) WITH PROPOFOL: SHX5813

## 2015-04-16 LAB — BASIC METABOLIC PANEL
ANION GAP: 10 (ref 5–15)
BUN: 27 mg/dL — ABNORMAL HIGH (ref 6–20)
CO2: 21 mmol/L — AB (ref 22–32)
Calcium: 8.5 mg/dL — ABNORMAL LOW (ref 8.9–10.3)
Chloride: 109 mmol/L (ref 101–111)
Creatinine, Ser: 1.41 mg/dL — ABNORMAL HIGH (ref 0.61–1.24)
GFR calc Af Amer: 52 mL/min — ABNORMAL LOW (ref >60)
GFR calc non Af Amer: 44 mL/min — ABNORMAL LOW (ref >60)
Glucose, Bld: 74 mg/dL (ref 65–99)
POTASSIUM: 3.2 mmol/L — AB (ref 3.5–5.1)
Sodium: 140 mmol/L (ref 135–145)

## 2015-04-16 LAB — GLUCOSE, CAPILLARY
GLUCOSE-CAPILLARY: 74 mg/dL (ref 65–99)
GLUCOSE-CAPILLARY: 84 mg/dL (ref 65–99)
Glucose-Capillary: 75 mg/dL (ref 65–99)
Glucose-Capillary: 81 mg/dL (ref 65–99)
Glucose-Capillary: 84 mg/dL (ref 65–99)
Glucose-Capillary: 88 mg/dL (ref 65–99)
Glucose-Capillary: 93 mg/dL (ref 65–99)

## 2015-04-16 LAB — CBC
HCT: 31.9 % — ABNORMAL LOW (ref 39.0–52.0)
HEMOGLOBIN: 10.9 g/dL — AB (ref 13.0–17.0)
MCH: 30.9 pg (ref 26.0–34.0)
MCHC: 34.2 g/dL (ref 30.0–36.0)
MCV: 90.4 fL (ref 78.0–100.0)
PLATELETS: 102 10*3/uL — AB (ref 150–400)
RBC: 3.53 MIL/uL — ABNORMAL LOW (ref 4.22–5.81)
RDW: 14 % (ref 11.5–15.5)
WBC: 5.8 10*3/uL (ref 4.0–10.5)

## 2015-04-16 SURGERY — ESOPHAGOGASTRODUODENOSCOPY (EGD) WITH PROPOFOL
Anesthesia: Monitor Anesthesia Care

## 2015-04-16 MED ORDER — HYDRALAZINE HCL 20 MG/ML IJ SOLN
10.0000 mg | Freq: Four times a day (QID) | INTRAMUSCULAR | Status: DC | PRN
  Administered 2015-04-16 – 2015-04-23 (×3): 10 mg via INTRAVENOUS
  Filled 2015-04-16 (×4): qty 1

## 2015-04-16 MED ORDER — SODIUM CHLORIDE 0.9 % IV SOLN
INTRAVENOUS | Status: DC
  Administered 2015-04-16 – 2015-04-21 (×4): via INTRAVENOUS

## 2015-04-16 MED ORDER — ACETAMINOPHEN 325 MG PO TABS: 650.0000 mg | ORAL_TABLET | Freq: Four times a day (QID) | ORAL | Status: DC | PRN

## 2015-04-16 MED ORDER — PROPRANOLOL HCL 10 MG PO TABS
10.0000 mg | ORAL_TABLET | Freq: Two times a day (BID) | ORAL | Status: DC
  Administered 2015-04-16 – 2015-04-23 (×14): 10 mg via ORAL
  Filled 2015-04-16 (×21): qty 1

## 2015-04-16 MED ORDER — INSULIN ASPART 100 UNIT/ML ~~LOC~~ SOLN
0.0000 [IU] | Freq: Three times a day (TID) | SUBCUTANEOUS | Status: DC
  Administered 2015-04-17 – 2015-04-22 (×4): 1 [IU] via SUBCUTANEOUS

## 2015-04-16 MED ORDER — HYDRALAZINE HCL 10 MG PO TABS
10.0000 mg | ORAL_TABLET | Freq: Once | ORAL | Status: DC
Start: 2015-04-16 — End: 2015-04-16
  Filled 2015-04-16: qty 1

## 2015-04-16 MED ORDER — HYDROMORPHONE HCL 1 MG/ML IJ SOLN
0.5000 mg | INTRAMUSCULAR | Status: DC | PRN
  Administered 2015-04-16: 0.5 mg via INTRAVENOUS
  Filled 2015-04-16: qty 1

## 2015-04-16 MED ORDER — HYDRALAZINE HCL 20 MG/ML IJ SOLN
INTRAMUSCULAR | Status: AC
  Filled 2015-04-16: qty 1

## 2015-04-16 MED ORDER — GLYCOPYRROLATE 0.2 MG/ML IJ SOLN
INTRAMUSCULAR | Status: DC | PRN
  Administered 2015-04-16: 0.2 mg via INTRAVENOUS

## 2015-04-16 MED ORDER — PROPOFOL INFUSION 10 MG/ML OPTIME
INTRAVENOUS | Status: DC | PRN
  Administered 2015-04-16: 50 ug/kg/min via INTRAVENOUS

## 2015-04-16 MED ORDER — HYDRALAZINE HCL 20 MG/ML IJ SOLN
5.0000 mg | Freq: Once | INTRAMUSCULAR | Status: AC
  Administered 2015-04-16: 5 mg via INTRAVENOUS

## 2015-04-16 MED ORDER — ENSURE ENLIVE PO LIQD
237.0000 mL | Freq: Three times a day (TID) | ORAL | Status: DC
  Administered 2015-04-16 – 2015-04-23 (×15): 237 mL via ORAL

## 2015-04-16 MED ORDER — PHENYLEPHRINE HCL 10 MG/ML IJ SOLN
INTRAMUSCULAR | Status: DC | PRN
  Administered 2015-04-16 (×2): 100 ug via INTRAVENOUS
  Administered 2015-04-16: 50 ug via INTRAVENOUS
  Administered 2015-04-16: 100 ug via INTRAVENOUS

## 2015-04-16 MED ORDER — HYDRALAZINE HCL 20 MG/ML IJ SOLN
10.0000 mg | Freq: Once | INTRAMUSCULAR | Status: AC
  Administered 2015-04-16: 10 mg via INTRAVENOUS

## 2015-04-16 MED ORDER — AMLODIPINE BESYLATE 5 MG PO TABS
5.0000 mg | ORAL_TABLET | Freq: Every day | ORAL | Status: DC
  Administered 2015-04-16 – 2015-04-19 (×4): 5 mg via ORAL
  Filled 2015-04-16 (×5): qty 1

## 2015-04-16 MED ORDER — SODIUM CHLORIDE 0.9 % IV SOLN: INTRAVENOUS | Status: DC

## 2015-04-16 MED ORDER — LACTATED RINGERS IV SOLN
INTRAVENOUS | Status: DC
Start: 2015-04-16 — End: 2015-04-16
  Administered 2015-04-16: 14:00:00 via INTRAVENOUS
  Administered 2015-04-16: 1000 mL via INTRAVENOUS

## 2015-04-16 MED ORDER — EPHEDRINE SULFATE 50 MG/ML IJ SOLN
INTRAMUSCULAR | Status: DC | PRN
  Administered 2015-04-16: 5 mg via INTRAVENOUS

## 2015-04-16 MED ORDER — ONDANSETRON HCL 4 MG/2ML IJ SOLN: 4.0000 mg | Freq: Four times a day (QID) | INTRAMUSCULAR | Status: DC | PRN

## 2015-04-16 MED ORDER — LIDOCAINE HCL (CARDIAC) 20 MG/ML IV SOLN
INTRAVENOUS | Status: DC | PRN
  Administered 2015-04-16: 60 mg via INTRAVENOUS

## 2015-04-16 NOTE — Progress Notes (Signed)
Patient complained of abd pain,bladder scan revealed >999,K. Craige Cotta notified,order received.In and out catheter performed via aseptic technique,obtained 575 cc of dark amber urine. Per son at bedside,the last time the pt had dilatation of esophagus,he had the same problem,unable to urinate.Will continue to monitor. Hyun Marsalis, Drinda Butts, Charity fundraiser

## 2015-04-16 NOTE — Anesthesia Postprocedure Evaluation (Signed)
  Anesthesia Post-op Note  Patient: Tanner Owen  Procedure(s) Performed: Procedure(s) (LRB): ESOPHAGOGASTRODUODENOSCOPY (EGD) WITH PROPOFOL (N/A)  Patient Location: PACU  Anesthesia Type: MAC  Level of Consciousness: awake and alert   Airway and Oxygen Therapy: Patient Spontanous Breathing  Post-op Pain: mild  Post-op Assessment: Post-op Vital signs reviewed, Patient's Cardiovascular Status Stable, Respiratory Function Stable, Patent Airway and No signs of Nausea or vomiting  Last Vitals:  Filed Vitals:   04/16/15 1455  BP: 169/92  Pulse: 77  Temp:   Resp: 14    Post-op Vital Signs: stable   Complications: No apparent anesthesia complications

## 2015-04-16 NOTE — Progress Notes (Signed)
Called Dr. Christella Hartigan to clarify orders being discontinued after patient came back from EGD,dilatation of esophagus.MD was not sure how it happened but did not mean to discontinue orders. MD advised RN to call primary MD on call if she can reorder as he does not know which orders to reorder.Donnamarie Poag notified. Tienna Bienkowski, Drinda Butts, Charity fundraiser

## 2015-04-16 NOTE — Anesthesia Preprocedure Evaluation (Addendum)
Anesthesia Evaluation  Patient identified by MRN, date of birth, ID band Patient awake    Reviewed: Allergy & Precautions, H&P , NPO status , Patient's Chart, lab work & pertinent test results  Airway Mallampati: I  TM Distance: >3 FB Neck ROM: Full    Dental  (+) Edentulous Upper, Edentulous Lower   Pulmonary former smoker,  breath sounds clear to auscultation  Pulmonary exam normal       Cardiovascular hypertension, On Medications and On Home Beta Blockers + Peripheral Vascular Disease Rhythm:Regular Rate:Normal     Neuro/Psych negative neurological ROS  negative psych ROS   GI/Hepatic Neg liver ROS, GERD-  ,  Endo/Other  diabetes, Type 2, Oral Hypoglycemic Agents  Renal/GU Renal disease  negative genitourinary   Musculoskeletal   Abdominal   Peds  Hematology negative hematology ROS (+) anemia ,   Anesthesia Other Findings   Reproductive/Obstetrics negative OB ROS                            Anesthesia Physical  Anesthesia Plan  ASA: IV  Anesthesia Plan: MAC   Post-op Pain Management:    Induction: Intravenous  Airway Management Planned: Natural Airway  Additional Equipment:   Intra-op Plan:   Post-operative Plan:   Informed Consent: I have reviewed the patients History and Physical, chart, labs and discussed the procedure including the risks, benefits and alternatives for the proposed anesthesia with the patient or authorized representative who has indicated his/her understanding and acceptance.   Dental advisory given  Plan Discussed with: CRNA and Anesthesiologist  Anesthesia Plan Comments: (Patients case was canceled yesterday due to uncontrolled hypertension, he was started on dual antihypertensive meds with internal medicine. Unfortunately he did not receive his oral BP meds today. His Blood pressure currently is 190 systolics after IV hydralazine PRN. I have discussed  with Dr. Christella Hartigan and his esophageal pathology could be cancerous in nature or such severe stricture he is unable to tolerate PO, even medications and therefore we agreed the benefit of the procedure outweigh the risks of his chronically elevated blood pressure. Mr. Mucci and his family are aware of risks/benefits and agree. )       Anesthesia Quick Evaluation

## 2015-04-16 NOTE — Transfer of Care (Signed)
Immediate Anesthesia Transfer of Care Note  Patient: Tanner Owen  Procedure(s) Performed: Procedure(s): ESOPHAGOGASTRODUODENOSCOPY (EGD) WITH PROPOFOL (N/A)  Patient Location: PACU and Endoscopy Unit  Anesthesia Type:MAC  Level of Consciousness: awake and alert   Airway & Oxygen Therapy: Patient Spontanous Breathing and Patient connected to nasal cannula oxygen  Post-op Assessment: Report given to RN and Post -op Vital signs reviewed and stable  Post vital signs: Reviewed and stable  Last Vitals:  Filed Vitals:   04/16/15 1316  BP: 241/71  Pulse:   Temp:   Resp:     Complications: No apparent anesthesia complications

## 2015-04-16 NOTE — H&P (View-Only) (Signed)
[facility] Progress Note    Since last GI note: EGD cancelled yesterday for severe HTN (257/71) and HR 46.  He was asymptomatic from this, his nntihypertensive meds were adjusted and this AM BM 182/82, HR 55.  He is very hard of hearing, this AM says he feels not well overall.  Objective: Vital signs in last 24 hours: Temp:  [97.7 F (36.5 C)-98.3 F (36.8 C)] 98.3 F (36.8 C) (08/02 0506) Pulse Rate:  [46-59] 55 (08/02 0506) Resp:  [14-18] 17 (08/02 0506) BP: (126-257)/(57-82) 182/82 mmHg (08/02 0506) SpO2:  [96 %-99 %] 96 % (08/02 0506) Weight:  [141 lb 9.6 oz (64.229 kg)] 141 lb 9.6 oz (64.229 kg) (08/01 2001) Last BM Date: 04/14/15 General: alert and oriented times 3 Heart: regular rate and rythm Abdomen: soft, non-tender, non-distended, normal bowel sounds   Lab Results:  Recent Labs  04/13/15 2249 04/14/15 0434 04/15/15 0455  WBC 9.5 12.0* 6.0  HGB 13.3 12.6* 9.9*  PLT 141* 189 104*  MCV 87.3 87.2 87.8    Recent Labs  04/13/15 2249 04/14/15 0434 04/15/15 0455  NA 137 140 141  K 3.2* 3.4* 3.0*  CL 100* 110 113*  CO2 27 20* 23  GLUCOSE 146* 151* 90  BUN 30* 29* 28*  CREATININE 1.50* 1.43* 1.46*  CALCIUM 9.2 8.7* 8.4*    Medications: Scheduled Meds: . amLODipine  5 mg Oral Daily  . docusate sodium  100 mg Oral Daily  . hydrALAZINE  10 mg Oral Once  . insulin aspart  0-9 Units Subcutaneous 6 times per day  . pantoprazole (PROTONIX) IV  40 mg Intravenous Q12H  . propranolol  10 mg Oral BID  . sodium chloride  3 mL Intravenous Q12H   Continuous Infusions: . sodium chloride 0.9 % 1,000 mL with potassium chloride 40 mEq infusion 50 mL/hr at 04/15/15 1654   PRN Meds:.acetaminophen **OR** acetaminophen, alum & mag hydroxide-simeth, hydrALAZINE, HYDROmorphone (DILAUDID) injection, ondansetron **OR** ondansetron (ZOFRAN) IV    Assessment/Plan: 81 y.o. male previous GE junction stricture, now with dysphagia, vomiting, regurg  For EGD  today now that BP under better control.    Jacobs, Daniel P, MD  04/16/2015, 7:14 AM [facility] Pager [phone]     

## 2015-04-16 NOTE — Interval H&P Note (Signed)
History and Physical Interval Note:  04/16/2015 1:50 PM  Tanner Owen  has presented today for surgery, with the diagnosis of dysphagia  The various methods of treatment have been discussed with the patient and family. After consideration of risks, benefits and other options for treatment, the patient has consented to  Procedure(s): ESOPHAGOGASTRODUODENOSCOPY (EGD) WITH PROPOFOL (N/A) as a surgical intervention .  The patient's history has been reviewed, patient examined, no change in status, stable for surgery.  I have reviewed the patient's chart and labs.  Questions were answered to the patient's satisfaction.     Rachael Fee

## 2015-04-16 NOTE — Progress Notes (Signed)
[facility] Progress Note    Since last GI note: EGD cancelled yesterday for severe HTN (257/71) and HR 46.  He was asymptomatic from this, his nntihypertensive meds were adjusted and this AM BM 182/82, HR 55.  He is very hard of hearing, this AM says he feels not well overall.  Objective: Vital signs in last 24 hours: Temp:  [97.7 F (36.5 C)-98.3 F (36.8 C)] 98.3 F (36.8 C) (08/02 0506) Pulse Rate:  [46-59] 55 (08/02 0506) Resp:  [14-18] 17 (08/02 0506) BP: (126-257)/(57-82) 182/82 mmHg (08/02 0506) SpO2:  [96 %-99 %] 96 % (08/02 0506) Weight:  [141 lb 9.6 oz (64.229 kg)] 141 lb 9.6 oz (64.229 kg) (08/01 2001) Last BM Date: 04/14/15 General: alert and oriented times 3 Heart: regular rate and rythm Abdomen: soft, non-tender, non-distended, normal bowel sounds   Lab Results:  Recent Labs  04/13/15 2249 04/14/15 0434 04/15/15 0455  WBC 9.5 12.0* 6.0  HGB 13.3 12.6* 9.9*  PLT 141* 189 104*  MCV 87.3 87.2 87.8    Recent Labs  04/13/15 2249 04/14/15 0434 04/15/15 0455  NA 137 140 141  K 3.2* 3.4* 3.0*  CL 100* 110 113*  CO2 27 20* 23  GLUCOSE 146* 151* 90  BUN 30* 29* 28*  CREATININE 1.50* 1.43* 1.46*  CALCIUM 9.2 8.7* 8.4*    Medications: Scheduled Meds: . amLODipine  5 mg Oral Daily  . docusate sodium  100 mg Oral Daily  . hydrALAZINE  10 mg Oral Once  . insulin aspart  0-9 Units Subcutaneous 6 times per day  . pantoprazole (PROTONIX) IV  40 mg Intravenous Q12H  . propranolol  10 mg Oral BID  . sodium chloride  3 mL Intravenous Q12H   Continuous Infusions: . sodium chloride 0.9 % 1,000 mL with potassium chloride 40 mEq infusion 50 mL/hr at 04/15/15 1654   PRN Meds:.acetaminophen **OR** acetaminophen, alum & mag hydroxide-simeth, hydrALAZINE, HYDROmorphone (DILAUDID) injection, ondansetron **OR** ondansetron (ZOFRAN) IV    Assessment/Plan: 81 y.o. male previous GE junction stricture, now with dysphagia, vomiting, regurg  For EGD  today now that BP under better control.    Rachael Fee, MD  04/16/2015, 7:14 AM [facility] Pager [phone]

## 2015-04-16 NOTE — Progress Notes (Signed)
PROGRESS NOTE  Tanner Owen ZOX:[id number] DOB: 25-Jan-1929 DOA: 04/13/2015 PCP: Ezequiel Kayser, MD  HPI: Patient is a 81 year old male with hypertension, diabetes, chronic kidney disease and history of esophageal strictures seen by gastroenterology, presented with acute on chronic poor by mouth intake, dysphagia as well as nausea. He was seen recently by Dr. Marina Goodell his gastroenterologist about a month ago, offered an EGD however patient wanted to see how he is doing on his own. He was admitted with dehydration and weakness in the setting of being unable to eat or drink appropriately for a week.   Subjective / 24 H Interval events - Patient is feeling well today, and awaiting EGD procedure. Denies nausea, vomiting, or dizziness.  Assessment/Plan: Principal Problem:   Dehydration Active Problems:   ESOPHAGEAL STRICTURE   Orthostasis   Nausea and vomiting   Hypokalemia   Chronic kidney disease (CKD), stage III (moderate)   Diabetes mellitus   Essential hypertension   Thrombocytopenia  Esophageal stricture - Gastroenterology consulted, EGD originally scheduled for yesterday morning but canceled due to elevated blood pressure.  - BP now under better control; EGD scheduled for this afternoon.  Hypertensive urgency - blood pressure improved - He is asymptomatic, no chest pain or headaches or blurry vision - Suspect he has a component of chronic elevated blood pressures and also in his previous office visits as well as his most recent ED visit his blood pressure was in the 170s systolic - Continue propranolol to 10 mg twice a day from 40 mg twice a day, also add Norvasc 5 mg - Closely monitor blood pressure  Bradycardia - Monitor on telemetry. - Asymptomatic  Dehydration with orthostatics - Continue hydration with IV fluids as he is not having good by mouth intake.  Chronic kidney disease stage III - Creatinine is stable at baseline, continue to monitor  Diabetes mellitus -  Hemoglobin A1c is 6.8. - Continue sliding scale coverage  Hypokalemia - Likely in the setting of poor by mouth intake - Replete and monitor  Hypertension - Continue propranolol  Thrombocytopenia - Acute on chronic, dating as far back as 2012 - Platelets 102; No evidence of bleeding, continue to monitor  Diet: Diet clear liquid Room service appropriate?: Yes; Fluid consistency:: Thin Fluids: NS 50 cc/h DVT Prophylaxis: SCDs  Code Status: Full Code Family Communication: discussed with ex-wife at bedside Disposition Plan: remain inpatient  Consultants:  GI  Procedures:  EGD today 8/2   Antibiotics  Anti-infectives    None     Objective  Filed Vitals:   04/15/15 2001 04/15/15 2324 04/16/15 0506 04/16/15 0732  BP: 187/71 163/71 182/82 169/67  Pulse: 51 53 55 55  Temp: 97.8 F (36.6 C)  98.3 F (36.8 C) 98.3 F (36.8 C)  TempSrc: Oral  Oral Oral  Resp: 18  17 17   Height:      Weight: 64.229 kg (141 lb 9.6 oz)     SpO2: 98%  96% 96%    Intake/Output Summary (Last 24 hours) at 04/16/15 1138 Last data filed at 04/16/15 1125  Gross per 24 hour  Intake   1115 ml  Output   1260 ml  Net   -145 ml   Filed Weights   04/14/15 0335 04/14/15 2047 04/15/15 2001  Weight: 60.464 kg (133 lb 4.8 oz) 62.143 kg (137 lb) 64.229 kg (141 lb 9.6 oz)    Exam:  GENERAL: Patient is lying supine in bed, appears somnolent and weak in NAD  HEENT: Pupils  are equal, round, and reactive to light and accommodation. Nares appeared normal. Mouth is dry without lesions. Mucous membranes are pale, dry. Posterior pharynx clear of any exudate or lesions. No scleral icterus.  LUNGS: Clear to auscultation, good air movement. No wheezing or crackles.  HEART: Regular rate and rhythm without murmur. 2+ peripheral pulses, no peripheral edema  ABDOMEN: Soft, non-tender, and non-distended. No guarding. +BS.  EXTREMITIES: No clubbing, cyanosis, or joint swelling noted.  NEUROLOGIC: non  focal, strength 4/5 in all 4.  SKIN: No ulceration or induration present.  Data Reviewed: Basic Metabolic Panel:  Recent Labs Lab 04/13/15 2249 04/14/15 0434 04/15/15 0455 04/16/15 0620  NA 137 140 141 140  K 3.2* 3.4* 3.0* 3.2*  CL 100* 110 113* 109  CO2 27 20* 23 21*  GLUCOSE 146* 151* 90 74  BUN 30* 29* 28* 27*  CREATININE 1.50* 1.43* 1.46* 1.41*  CALCIUM 9.2 8.7* 8.4* 8.5*   CBC:  Recent Labs Lab 04/13/15 2249 04/14/15 0434 04/15/15 0455 04/16/15 0620  WBC 9.5 12.0* 6.0 5.8  HGB 13.3 12.6* 9.9* 10.9*  HCT 37.2* 36.1* 28.1* 31.9*  MCV 87.3 87.2 87.8 90.4  PLT 141* 189 104* 102*   CBG:  Recent Labs Lab 04/15/15 1617 04/15/15 2000 04/15/15 2357 04/16/15 0506 04/16/15 0731  GLUCAP 105* 88 84 75 81    No results found for this or any previous visit (from the past 240 hour(s)).   Scheduled Meds: . amLODipine  5 mg Oral Daily  . docusate sodium  100 mg Oral Daily  . hydrALAZINE  10 mg Oral Once  . insulin aspart  0-9 Units Subcutaneous 6 times per day  . pantoprazole (PROTONIX) IV  40 mg Intravenous Q12H  . propranolol  10 mg Oral BID  . sodium chloride  3 mL Intravenous Q12H   Continuous Infusions: . sodium chloride 0.9 % 1,000 mL with potassium chloride 40 mEq infusion 50 mL/hr at 04/15/15 1654   Tanner Masson, PA-S  Time spent coordinating care: 25 minutes, greater than 50% of this time was spent in counseling, explanation of diagnosis, planning of further management, coordination of care.    Pamella Pert, MD [facility] Pager [phone]. If 7 PM - 7 AM, please contact night-coverage at [email or web address], password [facility] 04/16/2015, 11:38 AM  LOS: 2 days

## 2015-04-16 NOTE — Care Management Important Message (Signed)
Important Message  Patient Details  Name: Tanner Owen MRN: [id number] Date of Birth: February 28, 1929   Medicare Important Message Given:  Yes-second notification given    Orson Aloe 04/16/2015, 2:27 PM

## 2015-04-16 NOTE — Op Note (Signed)
[facility] [address] [place], [address]   ENDOSCOPY PROCEDURE REPORT  PATIENT: Tanner, Owen  MR#: [id number] BIRTHDATE: December 31, 1928 , 85  yrs. old GENDER: male ENDOSCOPIST: Rachael Fee, MD PROCEDURE DATE:  04/16/2015 PROCEDURE:  EGD w/ balloon dilation ASA CLASS:     Class IV INDICATIONS:  known GE junction stricture, dilated several times 2010-2011; Dr.  Marina Goodell recommended repeat EGD/dilation 2013 but patient cancelled the appt.  Now with dysphagia, FTT, weight loss; has been taking omeprazole  twice daily.Marland Kitchen MEDICATIONS: Monitored anesthesia care TOPICAL ANESTHETIC: none  DESCRIPTION OF PROCEDURE: After the risks benefits and alternatives of the procedure were thoroughly explained, informed consent was obtained.  The Pentax Gastroscope [id number] endoscope was introduced through the mouth and advanced to the second portion of the duodenum , Without limitations.  The instrument was slowly withdrawn as the mucosa was fully examined.  There was a tight GE junction sticture.  I estimate the lumen was 3-27mm across.  This did not allow for passage of adult gastroscope or pediatric gastroscope.  There was extensive mucosal sloughing throughout the esophagus.  This needed to be suctioned clear to get good views of the distal esophagus.  The stricture was dilated sequentially with wire guided balloon dilators (from 6mm to 10mm). There was typical superficial tear and minor mucosal bleeding following dilation.  I could not pass the adult gastroscope into the stomach following dilation.  Retroflexed views revealed no abnormalities.     The scope was then withdrawn from the patient and the procedure completed. COMPLICATIONS: There were no immediate complications.  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  Dilated today up to 10mm.  See above.  RECOMMENDATIONS: Will let him take thin liquids (up to ensure type  consistency).  If he is able to keep this down then will be OK for d/c and will coordinate with Dr. Marina Goodell repeat EGD, dilation in next 1-2 weeks.  He should continue PPI twice daily for now.    eSigned:  Rachael Fee, MD 04/16/2015 2:43 PM    CC: Yancey Flemings, MD

## 2015-04-17 ENCOUNTER — Encounter (HOSPITAL_COMMUNITY): Payer: Self-pay | Admitting: Gastroenterology

## 2015-04-17 ENCOUNTER — Telehealth: Payer: Self-pay

## 2015-04-17 LAB — CBC
HCT: 34.1 % — ABNORMAL LOW (ref 39.0–52.0)
HEMOGLOBIN: 11.9 g/dL — AB (ref 13.0–17.0)
MCH: 31 pg (ref 26.0–34.0)
MCHC: 34.9 g/dL (ref 30.0–36.0)
MCV: 88.8 fL (ref 78.0–100.0)
PLATELETS: 114 10*3/uL — AB (ref 150–400)
RBC: 3.84 MIL/uL — ABNORMAL LOW (ref 4.22–5.81)
RDW: 14.7 % (ref 11.5–15.5)
WBC: 9.1 10*3/uL (ref 4.0–10.5)

## 2015-04-17 LAB — BASIC METABOLIC PANEL
Anion gap: 9 (ref 5–15)
BUN: 21 mg/dL — ABNORMAL HIGH (ref 6–20)
CALCIUM: 8.6 mg/dL — AB (ref 8.9–10.3)
CO2: 22 mmol/L (ref 22–32)
CREATININE: 1.22 mg/dL (ref 0.61–1.24)
Chloride: 110 mmol/L (ref 101–111)
GFR, EST NON AFRICAN AMERICAN: 53 mL/min — AB (ref >60)
GLUCOSE: 86 mg/dL (ref 65–99)
Potassium: 3 mmol/L — ABNORMAL LOW (ref 3.5–5.1)
SODIUM: 141 mmol/L (ref 135–145)

## 2015-04-17 LAB — GLUCOSE, CAPILLARY
GLUCOSE-CAPILLARY: 75 mg/dL (ref 65–99)
GLUCOSE-CAPILLARY: 118 mg/dL — AB (ref 65–99)
Glucose-Capillary: 83 mg/dL (ref 65–99)
Glucose-Capillary: 139 mg/dL — ABNORMAL HIGH (ref 65–99)
Glucose-Capillary: 129 mg/dL — ABNORMAL HIGH (ref 65–99)

## 2015-04-17 MED ORDER — POTASSIUM CHLORIDE 20 MEQ PO PACK
40.0000 meq | PACK | ORAL | Status: DC
  Filled 2015-04-17 (×2): qty 2

## 2015-04-17 MED ORDER — HYDRALAZINE HCL 20 MG/ML IJ SOLN
5.0000 mg | Freq: Once | INTRAMUSCULAR | Status: AC
  Administered 2015-04-17: 5 mg via INTRAVENOUS

## 2015-04-17 MED ORDER — POTASSIUM CHLORIDE CRYS ER 20 MEQ PO TBCR
40.0000 meq | EXTENDED_RELEASE_TABLET | ORAL | Status: AC
  Administered 2015-04-17 (×2): 40 meq via ORAL
  Filled 2015-04-17 (×2): qty 2

## 2015-04-17 MED ORDER — HYDRALAZINE HCL 10 MG PO TABS
10.0000 mg | ORAL_TABLET | Freq: Three times a day (TID) | ORAL | Status: DC
  Administered 2015-04-17 – 2015-04-18 (×4): 10 mg via ORAL
  Filled 2015-04-17 (×3): qty 1

## 2015-04-17 NOTE — Progress Notes (Signed)
Patient attempted a couple times to urinate but only put out 10 ml. Bladder scan done and revealed >419 cc. In and out catheterization done with aseptic technique and got 275 cc of dark amber and foul smelling urine. Patient verbalized relief. Will continue to monitor. Shayaan Parke, Drinda Butts, Charity fundraiser

## 2015-04-17 NOTE — Progress Notes (Signed)
Pt states he feels as though he has to urinate; attempted to urinate a couple of times but only able to put out 25mL of urine in urinal. Pt states he is having lower abdominal discomfort. Bladder scan performed and reading of >429mL obtained. In and out catheterization performed and got 400cc of amber colored urine. Pt states his abdominal pressure and discomfort is relieved. Will continue to monitor.

## 2015-04-17 NOTE — Progress Notes (Signed)
Patient attempted a couple times to urinate but unable to,complained of bladder being full,bladder scan performed and got 659 cc.In and out catheterization performed via aseptic technique and got 475 cc of amber colored urine.Patient verbalized relief of abdominal pressure. Will continue to monitor. Hannan Hutmacher, Drinda Butts, Charity fundraiser

## 2015-04-17 NOTE — Telephone Encounter (Signed)
Left message for pt to call back  °

## 2015-04-17 NOTE — Progress Notes (Signed)
PROGRESS NOTE  Tanner Owen ZOX:[id number] DOB: 1928/10/16 DOA: 04/13/2015 PCP: Ezequiel Kayser, MD  HPI: Patient is a 81 year old male with hypertension, diabetes, chronic kidney disease and history of esophageal strictures seen by gastroenterology, presented with acute on chronic poor by mouth intake, dysphagia as well as nausea. He was seen recently by Dr. Marina Goodell his gastroenterologist about a month ago, offered an EGD however patient wanted to see how he is doing on his own. He was admitted with dehydration and weakness in the setting of being unable to eat or drink appropriately for a week.   Subjective / 24 H Interval events -  Patient reports not feeling well today post EGD. Having difficulty with sipping on liquids, struggling with ensure but no vomiting. Denies abdominal pain at this time. No fever.   Assessment/Plan: Principal Problem:   Dehydration Active Problems:   ESOPHAGEAL STRICTURE   Orthostasis   Nausea and vomiting   Hypokalemia   Chronic kidney disease (CKD), stage III (moderate)   Diabetes mellitus   Essential hypertension   Thrombocytopenia  Esophageal stricture - Gastroenterology consulted, EGD completed yesterday, 10mm dilation.  - continue to encourage PO intake, if no success GI will repeat EGD tomorrow for further dilation. - discussed with Dr. Christella Hartigan today   Urinary retention - patient unable to urinate early this am, complained of bladder being full - s/p I&O cath, monitor  FTT - due to esophageal stricture  Suspect protein calorie malnutrition - consult nutrition  Hypertension  - with hypertensive urgency on 8/1 - blood pressure improved today - He is asymptomatic, no chest pain or headaches or blurry vision - Suspect he has a component of chronic elevated blood pressures and also in his previous office visits as well as his most recent ED visit his blood pressure was in the 170s systolic - Continue propranolol to 10 mg twice a day from 40  mg twice a day and Norvasc 5 mg, added hydralazine standing today - Closely monitor blood pressure  Bradycardia - Monitor on telemetry. - Asymptomatic, rates better after decreasing propranolol  Dehydration with orthostatics - Continue hydration with IV fluids as he is not having good by mouth intake.  Chronic kidney disease stage III - Creatinine is stable at baseline, continue to monitor  Diabetes mellitus - Hemoglobin A1c is 6.8. - Continue sliding scale coverage  Hypokalemia - Likely in the setting of poor by mouth intake - replete  Thrombocytopenia - Acute on chronic, dating as far back as 2012 - Platelets improving at 114; No evidence of bleeding, continue to monitor   Diet: Diet clear liquid Room service appropriate?: Yes; Fluid consistency:: Thin Fluids: NS 50 cc/h DVT Prophylaxis: SCDs  Code Status: Prior Family Communication: discussed with ex-wife at bedside Disposition Plan: remain inpatient  Consultants:  GI  Procedures:  EGD 8/2   Antibiotics  Anti-infectives    None     Objective  Filed Vitals:   04/16/15 2345 04/17/15 0123 04/17/15 0406 04/17/15 0902  BP: 193/72 188/71 180/66 149/57  Pulse: 64 63 62 59  Temp:   97.8 F (36.6 C) 97.6 F (36.4 C)  TempSrc:   Oral Oral  Resp:   18 18  Height:      Weight:      SpO2:   96% 97%    Intake/Output Summary (Last 24 hours) at 04/17/15 1617 Last data filed at 04/17/15 1314  Gross per 24 hour  Intake 915.83 ml  Output   1175 ml  Net -259.17 ml   Filed Weights   04/14/15 2047 04/15/15 2001 04/16/15 2028  Weight: 62.143 kg (137 lb) 64.229 kg (141 lb 9.6 oz) 64.91 kg (143 lb 1.6 oz)    Exam:  GENERAL: Elderly is lying supine in bed, appears weak in NAD  HEENT: Pupils are equal, round, and reactive to light and accommodation. Nares appeared normal. Mouth is dry without lesions. Mucous membranes are pale, dry. Posterior pharynx clear of any exudate or lesions. No scleral  icterus.  LUNGS: Clear to auscultation, good air movement. No wheezing or crackles.  HEART: Regular rate and rhythm without murmur. 2+ peripheral pulses, no peripheral edema  ABDOMEN: Soft, non-tender, and non-distended. No guarding. +BS.  EXTREMITIES: No clubbing, cyanosis, or joint swelling noted.  NEUROLOGIC: non focal, strength 4/5 in all 4.  SKIN: No ulceration or induration present.  Data Reviewed: Basic Metabolic Panel:  Recent Labs Lab 04/13/15 2249 04/14/15 0434 04/15/15 0455 04/16/15 0620 04/17/15 0533  NA 137 140 141 140 141  K 3.2* 3.4* 3.0* 3.2* 3.0*  CL 100* 110 113* 109 110  CO2 27 20* 23 21* 22  GLUCOSE 146* 151* 90 74 86  BUN 30* 29* 28* 27* 21*  CREATININE 1.50* 1.43* 1.46* 1.41* 1.22  CALCIUM 9.2 8.7* 8.4* 8.5* 8.6*   CBC:  Recent Labs Lab 04/13/15 2249 04/14/15 0434 04/15/15 0455 04/16/15 0620 04/17/15 0533  WBC 9.5 12.0* 6.0 5.8 9.1  HGB 13.3 12.6* 9.9* 10.9* 11.9*  HCT 37.2* 36.1* 28.1* 31.9* 34.1*  MCV 87.3 87.2 87.8 90.4 88.8  PLT 141* 189 104* 102* 114*   CBG:  Recent Labs Lab 04/16/15 2031 04/16/15 2344 04/17/15 0356 04/17/15 0751 04/17/15 1205  GLUCAP 88 74 83 75 139*    Scheduled Meds: . amLODipine  5 mg Oral Daily  . feeding supplement (ENSURE ENLIVE)  237 mL Oral TID  . hydrALAZINE  10 mg Oral 3 times per day  . insulin aspart  0-9 Units Subcutaneous TID WC  . propranolol  10 mg Oral BID   Continuous Infusions: . sodium chloride 50 mL/hr at 04/16/15 2117   Vicente Masson, PA-S  Time spent coordinating care: 25 minutes, greater than 50% of this time was spent in counseling, explanation of diagnosis, planning of further management, coordination of care.    Pamella Pert, MD [facility] Pager [phone]. If 7 PM - 7 AM, please contact night-coverage at [email or web address], password [facility] 04/17/2015, 4:17 PM  LOS: 3 days

## 2015-04-17 NOTE — Progress Notes (Signed)
[facility] Progress Note    Since last GI note: Dilated GE junction stricture to 10mm yesterday. Sipping on clears without vomiting, but ensure is more of a struggle. He is keeping ensure down, but it seems to be taking him a long time.  Objective: Vital signs in last 24 hours: Temp:  [97.4 F (36.3 C)-97.9 F (36.6 C)] 97.6 F (36.4 C) (08/03 0902) Pulse Rate:  [59-110] 59 (08/03 0902) Resp:  [12-18] 18 (08/03 0902) BP: (149-241)/(57-109) 149/57 mmHg (08/03 0902) SpO2:  [94 %-99 %] 97 % (08/03 0902) Weight:  [143 lb 1.6 oz (64.91 kg)] 143 lb 1.6 oz (64.91 kg) (08/02 2028) Last BM Date: 04/14/15 General: very hard of hearing Heart: regular rate and rythm Abdomen: soft, non-tender, non-distended, normal bowel sounds   Lab Results:  Recent Labs  04/15/15 0455 04/16/15 0620 04/17/15 0533  WBC 6.0 5.8 9.1  HGB 9.9* 10.9* 11.9*  PLT 104* 102* 114*  MCV 87.8 90.4 88.8    Recent Labs  04/15/15 0455 04/16/15 0620 04/17/15 0533  NA 141 140 141  K 3.0* 3.2* 3.0*  CL 113* 109 110  CO2 23 21* 22  GLUCOSE 90 74 86  BUN 28* 27* 21*  CREATININE 1.46* 1.41* 1.22  CALCIUM 8.4* 8.5* 8.6*   Medications: Scheduled Meds: . amLODipine  5 mg Oral Daily  . feeding supplement (ENSURE ENLIVE)  237 mL Oral TID  . hydrALAZINE  10 mg Oral 3 times per day  . insulin aspart  0-9 Units Subcutaneous TID WC  . propranolol  10 mg Oral BID   Continuous Infusions: . sodium chloride 50 mL/hr at 04/16/15 2117   PRN Meds:.acetaminophen, hydrALAZINE, HYDROmorphone (DILAUDID) injection, ondansetron    Assessment/Plan: 81 y.o. male with GE junction stricture  Not sure that yesterday's 10mm dilation is enough to allow for adequate nutrition, fluid intake.  I spoke with RN, they will continue to encourage PO intake with ensure, clears.  If no real success then I plan repeat EGD with further dilation tomorrow.  If that is not helpful either, need to consider feeding tube  placement.    Rachael Fee, MD  04/17/2015, 11:15 AM [facility] Pager [phone]

## 2015-04-17 NOTE — Telephone Encounter (Signed)
-----   Message from Hilarie Fredrickson, MD sent at 04/17/2015 12:42 PM EDT ----- Regarding: EGD dilation [facility] Gari Trovato, Set up for EGD with balloon dilation in [facility]. May open up 7:30 AM slot on August 17 or August 19. I confirmed with Baxter Hire that proper balloon sizes are available. Thank you JP ----- Message -----    From: Rachael Fee, MD    Sent: 04/16/2015   2:47 PM      To: Hilarie Fredrickson, MD  JP,  Below is EGD from today.  You'll probably remember him from several EGDs, balloon dilations in 2010-2011.  I just dilated him to 10mm, planning to send him home tomorrow if he keeps down thin liquids and ensure. He's going to need repeat EGD/dilation in next 1-2 weeks I expect.  Sending this to you now so you can start looking ahead to decent times (he definitely needs MAC, I'd do him at hosp if I were you due to such frailty, if you're planning [facility] instead you should make sure we have the smaller sized wire guided balloons).   Thanks DJ   There was a tight GE junction sticture.  I estimate the lumen was 3-36mm across.  This did not allow for passage of adult gastroscope or pediatric gastroscope.  There was extensive mucosal sloughing throughout the esophagus.  This needed to be suctioned clear to get good views of the distal esophagus.  The stricture was dilated sequentially with wire guided balloon dilators (from 6mm to 10mm).  There was typical superficial tear and minor mucosal bleeding following dilation.  I could not pass the adult gastroscope into the stomach following dilation.  Retroflexed views revealed no abnormalities.     The scope was then withdrawn from the patient and the procedure completed. COMPLICATIONS: There were no immediate complications.      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  Dilated today up to 10mm.  See above.     RECOMMENDATIONS: Will let him take thin liquids (up to ensure type consistency).  If he is able to keep this down then will be OK for  d/c and will coordinate with Dr. Marina Goodell for repeat EGD, dilation in next 1-2 weeks.He should continue PPI twice daily for now.

## 2015-04-18 ENCOUNTER — Encounter (HOSPITAL_COMMUNITY)
Admission: EM | Disposition: A | Discharge status: Hospice | Payer: Self-pay | Source: Home / Self Care | Attending: Internal Medicine

## 2015-04-18 ENCOUNTER — Encounter (HOSPITAL_COMMUNITY): Payer: Self-pay | Admitting: Anesthesiology

## 2015-04-18 DIAGNOSIS — Z7189 Other specified counseling: Secondary | ICD-10-CM | POA: Diagnosis present

## 2015-04-18 DIAGNOSIS — E43 Unspecified severe protein-calorie malnutrition: Secondary | ICD-10-CM | POA: Diagnosis present

## 2015-04-18 LAB — BASIC METABOLIC PANEL
Anion gap: 8 (ref 5–15)
BUN: 22 mg/dL — AB (ref 6–20)
CHLORIDE: 107 mmol/L (ref 101–111)
CO2: 23 mmol/L (ref 22–32)
CREATININE: 1.28 mg/dL — AB (ref 0.61–1.24)
Calcium: 8.3 mg/dL — ABNORMAL LOW (ref 8.9–10.3)
GFR calc non Af Amer: 50 mL/min — ABNORMAL LOW (ref >60)
GFR, EST AFRICAN AMERICAN: 58 mL/min — AB (ref >60)
GLUCOSE: 91 mg/dL (ref 65–99)
Potassium: 2.7 mmol/L — CL (ref 3.5–5.1)
Sodium: 138 mmol/L (ref 135–145)

## 2015-04-18 LAB — GLUCOSE, CAPILLARY
GLUCOSE-CAPILLARY: 91 mg/dL (ref 65–99)
GLUCOSE-CAPILLARY: 95 mg/dL (ref 65–99)
GLUCOSE-CAPILLARY: 91 mg/dL (ref 65–99)
Glucose-Capillary: 104 mg/dL — ABNORMAL HIGH (ref 65–99)
Glucose-Capillary: 114 mg/dL — ABNORMAL HIGH (ref 65–99)
Glucose-Capillary: 92 mg/dL (ref 65–99)

## 2015-04-18 LAB — CBC
HCT: 31.2 % — ABNORMAL LOW (ref 39.0–52.0)
Hemoglobin: 10.9 g/dL — ABNORMAL LOW (ref 13.0–17.0)
MCH: 30.7 pg (ref 26.0–34.0)
MCHC: 34.9 g/dL (ref 30.0–36.0)
MCV: 87.9 fL (ref 78.0–100.0)
PLATELETS: 103 10*3/uL — AB (ref 150–400)
RBC: 3.55 MIL/uL — ABNORMAL LOW (ref 4.22–5.81)
RDW: 14.7 % (ref 11.5–15.5)
WBC: 7 10*3/uL (ref 4.0–10.5)

## 2015-04-18 LAB — MAGNESIUM: MAGNESIUM: 1.6 mg/dL — AB (ref 1.7–2.4)

## 2015-04-18 SURGERY — CANCELLED PROCEDURE

## 2015-04-18 MED ORDER — POTASSIUM CHLORIDE 20 MEQ PO PACK
40.0000 meq | PACK | ORAL | Status: DC
  Filled 2015-04-18 (×5): qty 2

## 2015-04-18 MED ORDER — SODIUM CHLORIDE 0.9 % IV SOLN: INTRAVENOUS | Status: DC

## 2015-04-18 MED ORDER — MAGNESIUM SULFATE 2 GM/50ML IV SOLN
2.0000 g | Freq: Once | INTRAVENOUS | Status: AC
  Administered 2015-04-18: 2 g via INTRAVENOUS
  Filled 2015-04-18: qty 50

## 2015-04-18 MED ORDER — POTASSIUM CHLORIDE CRYS ER 20 MEQ PO TBCR: 40.0000 meq | EXTENDED_RELEASE_TABLET | ORAL | Status: DC

## 2015-04-18 MED ORDER — HYDRALAZINE HCL 25 MG PO TABS
25.0000 mg | ORAL_TABLET | Freq: Three times a day (TID) | ORAL | Status: DC
  Administered 2015-04-18 – 2015-04-20 (×4): 25 mg via ORAL
  Filled 2015-04-18 (×5): qty 1

## 2015-04-18 MED ORDER — POTASSIUM CHLORIDE CRYS ER 20 MEQ PO TBCR
40.0000 meq | EXTENDED_RELEASE_TABLET | ORAL | Status: AC
  Administered 2015-04-18 (×3): 40 meq via ORAL
  Filled 2015-04-18 (×2): qty 2

## 2015-04-18 NOTE — Progress Notes (Signed)
PROGRESS NOTE  Tanner Owen UJW:[id number] DOB: 11/22/1928 DOA: 04/13/2015 PCP: Ezequiel Kayser, MD  HPI: Patient is a 81 year old male with hypertension, diabetes, chronic kidney disease and history of esophageal strictures seen by gastroenterology, presented with acute on chronic poor by mouth intake, dysphagia as well as nausea. He was seen recently by Dr. Marina Goodell his gastroenterologist about a month ago, offered an EGD however patient wanted to see how he is doing on his own. He was admitted with dehydration and weakness in the setting of being unable to eat or drink appropriately for a week.   Subjective / 24 H Interval events -  Patient reports not feeling well today post EGD. Having difficulty with sipping on liquids, struggling with ensure but no vomiting. Denies abdominal pain at this time. No fever.   Assessment/Plan: Principal Problem:   Dehydration Active Problems:   ESOPHAGEAL STRICTURE   Orthostasis   Nausea and vomiting   Hypokalemia   Chronic kidney disease (CKD), stage III (moderate)   Diabetes mellitus   Essential hypertension   Thrombocytopenia   Protein-calorie malnutrition, severe  Esophageal stricture - Gastroenterology consulted, EGD completed yesterday, 10mm dilation.  - continue to encourage PO intake, if no success GI will repeat EGD tomorrow for further dilation. - discussed with Dr. Christella Hartigan  - discussed with patient and patient's son today. Patient is telling me that he is quite tired of all this. His son is asking about hospice on whether that would be an option, but wants to think and discuss more about his father's future, on whether he would want a feeding tube or not and so on. I have consulted palliative care today, family agreeable to talk with them.   Urinary retention - foley  FTT - due to esophageal stricture  Severe protein calorie malnutrition - consult nutrition  Hypertension  - with hypertensive urgency on 8/1 - blood pressure  improved today - He is asymptomatic, no chest pain or headaches or blurry vision - Suspect he has a component of chronic elevated blood pressures and also in his previous office visits as well as his most recent ED visit his blood pressure was in the 170s systolic - Continue propranolol to 10 mg twice a day from 40 mg twice a day and Norvasc 5 mg, increase hydralazine - Closely monitor blood pressure  Bradycardia - Monitor on telemetry. - Asymptomatic, rates better after decreasing propranolol  Dehydration with orthostatics - Continue hydration with IV fluids as he is not having good by mouth intake.  Chronic kidney disease stage III - Creatinine is stable at baseline, continue to monitor  Diabetes mellitus - Hemoglobin A1c is 6.8. - Continue sliding scale coverage  Hypokalemia - Likely in the setting of poor by mouth intake - replete   Thrombocytopenia - Acute on chronic, dating as far back as 2012 - Platelets overall stable; No evidence of bleeding, continue to monitor   Diet: Diet clear liquid Room service appropriate?: Yes; Fluid consistency:: Thin Fluids: NS 50 cc/h DVT Prophylaxis: SCDs  Code Status: Prior Family Communication: discussed with son bedside Disposition Plan: remain inpatient  Consultants:  GI  Procedures:  EGD 8/2   Antibiotics  Anti-infectives    None     Objective  Filed Vitals:   04/17/15 2044 04/18/15 0431 04/18/15 0803 04/18/15 1022  BP: 192/80 180/73 183/63 195/82  Pulse: 72 62 65   Temp: 98.6 F (37 C) 98.9 F (37.2 C) 98.7 F (37.1 C) 99.3 F (37.4 C)  TempSrc:  Oral Oral Oral   Resp: 16 18 18 15   Height:      Weight: 67.405 kg (148 lb 9.6 oz)     SpO2: 97% 97% 97%     Intake/Output Summary (Last 24 hours) at 04/18/15 1405 Last data filed at 04/18/15 0944  Gross per 24 hour  Intake   1800 ml  Output   1060 ml  Net    740 ml   Filed Weights   04/15/15 2001 04/16/15 2028 04/17/15 2044  Weight: 64.229 kg (141 lb  9.6 oz) 64.91 kg (143 lb 1.6 oz) 67.405 kg (148 lb 9.6 oz)    Exam:  GENERAL: Elderly is lying supine in bed, appears weak in NAD  HEENT: Pupils are equal, round, and reactive to light and accommodation. Nares appeared normal. Mouth is dry without lesions. Mucous membranes are pale, dry. Posterior pharynx clear of any exudate or lesions. No scleral icterus.  LUNGS: Clear to auscultation, good air movement. No wheezing or crackles.  HEART: Regular rate and rhythm without murmur. 2+ peripheral pulses, no peripheral edema  ABDOMEN: Soft, non-tender, and non-distended. No guarding. +BS.  EXTREMITIES: No clubbing, cyanosis, or joint swelling noted.  NEUROLOGIC: non focal, strength 4/5 in all 4.  SKIN: No ulceration or induration present.  Data Reviewed: Basic Metabolic Panel:  Recent Labs Lab 04/14/15 0434 04/15/15 0455 04/16/15 0620 04/17/15 0533 04/18/15 0448  NA 140 141 140 141 138  K 3.4* 3.0* 3.2* 3.0* 2.7*  CL 110 113* 109 110 107  CO2 20* 23 21* 22 23  GLUCOSE 151* 90 74 86 91  BUN 29* 28* 27* 21* 22*  CREATININE 1.43* 1.46* 1.41* 1.22 1.28*  CALCIUM 8.7* 8.4* 8.5* 8.6* 8.3*  MG  --   --   --   --  1.6*   CBC:  Recent Labs Lab 04/14/15 0434 04/15/15 0455 04/16/15 0620 04/17/15 0533 04/18/15 0448  WBC 12.0* 6.0 5.8 9.1 7.0  HGB 12.6* 9.9* 10.9* 11.9* 10.9*  HCT 36.1* 28.1* 31.9* 34.1* 31.2*  MCV 87.2 87.8 90.4 88.8 87.9  PLT 189 104* 102* 114* 103*   CBG:  Recent Labs Lab 04/17/15 2047 04/18/15 0017 04/18/15 0430 04/18/15 0801 04/18/15 1149  GLUCAP 118* 104* 91 95 91    Scheduled Meds: . amLODipine  5 mg Oral Daily  . feeding supplement (ENSURE ENLIVE)  237 mL Oral TID  . hydrALAZINE  10 mg Oral 3 times per day  . insulin aspart  0-9 Units Subcutaneous TID WC  . magnesium sulfate 1 - 4 g bolus IVPB  2 g Intravenous Once  . potassium chloride  40 mEq Oral Q3H  . propranolol  10 mg Oral BID   Continuous Infusions: . sodium chloride 50 mL/hr  at 04/16/15 2117    Time spent coordinating care: 25 minutes, greater than 50% of this time was spent in counseling, explanation of diagnosis, planning of further management, coordination of care.    Pamella Pert, MD [facility] Pager [phone]. If 7 PM - 7 AM, please contact night-coverage at [email or web address], password [facility] 04/18/2015, 2:05 PM  LOS: 4 days

## 2015-04-18 NOTE — Telephone Encounter (Signed)
Left message for pt to call back  °

## 2015-04-18 NOTE — Progress Notes (Addendum)
[facility] Progress Note    Since last GI note: S/p dilation with EGD, balloon yesterday.  He's keeping down water, keeping down ensure. It takes him a long time to drink these.  He is hard of hearing but it seems like he wants to be left alone.  Says "I'm 85".  Objective: Vital signs in last 24 hours: Temp:  [97.6 F (36.4 C)-98.9 F (37.2 C)] 98.9 F (37.2 C) (08/04 0431) Pulse Rate:  [59-72] 62 (08/04 0431) Resp:  [16-18] 18 (08/04 0431) BP: (149-192)/(57-80) 180/73 mmHg (08/04 0431) SpO2:  [97 %] 97 % (08/04 0431) Weight:  [148 lb 9.6 oz (67.405 kg)] 148 lb 9.6 oz (67.405 kg) (08/03 2044) Last BM Date: 04/17/15 General: alert and oriented times 3, frail, elderly Heart: regular rate and rythm Abdomen: soft, non-tender, non-distended, normal bowel sounds   Lab Results:  Recent Labs  04/16/15 0620 04/17/15 0533 04/18/15 0448  WBC 5.8 9.1 7.0  HGB 10.9* 11.9* 10.9*  PLT 102* 114* 103*  MCV 90.4 88.8 87.9    Recent Labs  04/16/15 0620 04/17/15 0533 04/18/15 0448  NA 140 141 138  K 3.2* 3.0* 2.7*  CL 109 110 107  CO2 21* 22 23  GLUCOSE 74 86 91  BUN 27* 21* 22*  CREATININE 1.41* 1.22 1.28*  CALCIUM 8.5* 8.6* 8.3*    Medications: Scheduled Meds: . amLODipine  5 mg Oral Daily  . feeding supplement (ENSURE ENLIVE)  237 mL Oral TID  . hydrALAZINE  10 mg Oral 3 times per day  . insulin aspart  0-9 Units Subcutaneous TID WC  . potassium chloride  40 mEq Oral Q4H  . propranolol  10 mg Oral BID   Continuous Infusions: . sodium chloride 50 mL/hr at 04/16/15 2117   PRN Meds:.acetaminophen, hydrALAZINE, HYDROmorphone (DILAUDID) injection, ondansetron    Assessment/Plan: 81 y.o. male with GE junction stricture  He is keeping down liquids but having to really struggle (took hours to finish his 2-3 cans of ensure).  I recommended we repeat EGD, dilation this AM.  I brought up the idea of feeding tube with family (ex-wife) in the room yesterday.  I  mentioned it today to the patient, he was not very receptive to the idea.   Kendarious Gudino P, MD  04/18/2015, 7:20 AM [facility] Pager [phone]    Addendum: The EGD was cancelled by anesthesia for K 2.7. I spoke with his ex-wife about this. Planning for repeat attempt tomorrow.  I also recommended that they consider feeding tube (would likely be via IR since stricture is so tight).  She explained that he has asked about letting him die instead, he is "ready to go."  I will leave that decision to family however I think his main issue is that is is unable to get adequate nutrition and probably hydration and this could probably be remedied with feeding tube. Would continue to try to dilate the presumably benign GE junction stricture.  Should consider palliative care consult. 

## 2015-04-18 NOTE — Progress Notes (Signed)
Initial Nutrition Assessment  DOCUMENTATION CODES:   Severe malnutrition in context of acute illness/injury  INTERVENTION:  If PO intake does not improve and dysphagia is still present, may need to consider enteral nutrition (pending family decision). Jevity 1.2 via tube at 25 ml/hr and increasing by 10 ml every 8 hours to goal rate of 65 ml/hr to provide 1872 kcal, 87 grams of protein, and 1264 ml of free water.   Monitor magnesium, potassium, and phosphorus daily for at least 3 days, MD to replete as needed, as pt is at risk for refeeding syndrome given no PO intake for 7 days PTA.  Continue Ensure Enlive po TID, each supplement provides 350 kcal and 20 grams of protein.  RD to continue to monitor.   NUTRITION DIAGNOSIS:   Malnutrition related to acute illness as evidenced by energy intake < or equal to 50% for > or equal to 5 days, moderate depletions of muscle mass, moderate depletion of body fat.  GOAL:   Patient will meet greater than or equal to 90% of their needs  MONITOR:   PO intake, Supplement acceptance, Weight trends, Labs, I & O's  REASON FOR ASSESSMENT:   Consult Assessment of nutrition requirement/status  ASSESSMENT:   Patient is a 81 year old male with hypertension, diabetes, chronic kidney disease and history of esophageal strictures seen by gastroenterology, presented with acute on chronic poor by mouth intake, dysphagia as well as nausea. He was seen recently by Dr. Marina Goodell his gastroenterologist about a month ago, offered an EGD however patient wanted to see how he is doing on his own. He was admitted with dehydration and weakness in the setting of being unable to eat or drink appropriately for a week.   Family at bedside. She reports pt with no po intake for 1 week PTA due to decreased appetite and dysphagia. Pt is at risk for refeeding syndrome. Usual body weight unknown.  Pt is currently on clear liquid. Pt has not been able to consume his food at meals,  however did consume an Ensure Shake yesterday. Family reports pt was scheduled for a EGD this AM, however potassium found to be very low, thus need repleating. EGD rescheduled for tomorrow. Pt currently has Ensure ordered. RD to continue with current orders.   Nutrition-Focused physical exam completed. Findings are moderate fat depletion, moderate muscle depletion, and no edema.   Labs: Low potassium, magnesium, calcium, GFR. High BUN and creatinine.   Diet Order:  Diet clear liquid Room service appropriate?: Yes; Fluid consistency:: Thin  Skin:  Reviewed, no issues  Last BM:  8/3  Height:   Ht Readings from Last 1 Encounters:  04/14/15  (1.803 m)    Weight:   Wt Readings from Last 1 Encounters:  04/17/15 148 lb 9.6 oz (67.405 kg)    Ideal Body Weight:  78 kg  BMI:  Body mass index is 20.73 kg/(m^2).  Estimated Nutritional Needs:   Kcal:  1850-2050  Protein:  80-95 grams  Fluid:  1.8 - 2. L/day  EDUCATION NEEDS:   No education needs identified at this time  Roslyn Smiling, MS, RD, LDN Pager # [phone] After hours/ weekend pager # [phone]

## 2015-04-18 NOTE — Progress Notes (Signed)
Results for BRIGHTEN, ORNDOFF (MRN [id number]) as of 04/18/2015 06:30  Ref. Range 04/18/2015 04:48  Potassium Latest Ref Range: 3.5-5.1 mmol/L 2.7 (LL)  Paged MD on call awaiting return call.

## 2015-04-18 NOTE — Care Management Note (Signed)
Case Management Note  Patient Details  Name: STEVENS MAGWOOD MRN: [id number] Date of Birth: Dec 13, 1928  Subjective/Objective:          CM following for progression and d/c planning.          Action/Plan: 04/18/2015 Notified by Romero Belling that this pt is active with [facility], HHPT and HHOT. Also noted that pall care will meet with pt re ongoing care needs. Will follow for planning with family.   Expected Discharge Date:  04/22/2015            Expected Discharge Plan:  Home w Home Health Services  In-House Referral:  NA  Discharge planning Services  CM Consult, [place]  Post Acute Care Choice:  Home Health Choice offered to:  Patient  DME Arranged:    DME Agency:     HH Arranged:  RN, PT, OT HH Agency:  [facility]  Status of Service:  In process, will continue to follow  Medicare Important Message Given:  Yes-second notification given Date Medicare IM Given:    Medicare IM give by:    Date Additional Medicare IM Given:    Additional Medicare Important Message give by:     If discussed at Long Length of Stay Meetings, dates discussed:    Additional Comments:  Starlyn Skeans, RN 04/18/2015, 3:58 PM

## 2015-04-18 NOTE — Progress Notes (Signed)
Attempted to meet with patient. His son was also in the room.  On entering, patient asked why I was there. I explained I was a physician from the palliative care team and asked if we could talk for a while. He replied that he is scheduled for another dilation tomorrow and he is uninterested in speaking with me at this time.  He then stated he was tired and was going to rest.  I left my card with his son and patient was agreeable to me stopping to check on him tomorrow after his procedure.  Follow-up tomorrow.  Romie Minus, MD [facility] Palliative Medicine Team [phone]

## 2015-04-18 NOTE — Progress Notes (Signed)
Patient attempted to use urinal but only able to urinate a few drops. Bladder scan performed and got >441 cc. In and out catheterization done with aseptic technique and got 325 cc of amber, foul smelling urine.Patient tolerated procedure well. Patient verbalized relief. Will continue to monitor. [place], Drinda Butts, Charity fundraiser

## 2015-04-19 ENCOUNTER — Encounter (HOSPITAL_COMMUNITY): Payer: Self-pay | Admitting: *Deleted

## 2015-04-19 ENCOUNTER — Inpatient Hospital Stay (HOSPITAL_COMMUNITY): Payer: Medicare Other | Admitting: Certified Registered"

## 2015-04-19 ENCOUNTER — Encounter (HOSPITAL_COMMUNITY)
Admission: EM | Disposition: A | Discharge status: Hospice | Payer: Self-pay | Source: Home / Self Care | Attending: Internal Medicine

## 2015-04-19 DIAGNOSIS — E43 Unspecified severe protein-calorie malnutrition: Secondary | ICD-10-CM

## 2015-04-19 DIAGNOSIS — Z789 Other specified health status: Secondary | ICD-10-CM

## 2015-04-19 DIAGNOSIS — Z515 Encounter for palliative care: Secondary | ICD-10-CM

## 2015-04-19 HISTORY — PX: ESOPHAGOGASTRODUODENOSCOPY (EGD) WITH PROPOFOL: SHX5813

## 2015-04-19 LAB — POCT I-STAT 4, (NA,K, GLUC, HGB,HCT)
GLUCOSE: 87 mg/dL (ref 65–99)
HEMATOCRIT: 32 % — AB (ref 39.0–52.0)
Hemoglobin: 10.9 g/dL — ABNORMAL LOW (ref 13.0–17.0)
POTASSIUM: 4.4 mmol/L (ref 3.5–5.1)
SODIUM: 142 mmol/L (ref 135–145)

## 2015-04-19 LAB — GLUCOSE, CAPILLARY
GLUCOSE-CAPILLARY: 84 mg/dL (ref 65–99)
Glucose-Capillary: 77 mg/dL (ref 65–99)
Glucose-Capillary: 81 mg/dL (ref 65–99)
Glucose-Capillary: 85 mg/dL (ref 65–99)
Glucose-Capillary: 94 mg/dL (ref 65–99)
Glucose-Capillary: 94 mg/dL (ref 65–99)

## 2015-04-19 SURGERY — ESOPHAGOGASTRODUODENOSCOPY (EGD) WITH PROPOFOL
Anesthesia: Monitor Anesthesia Care

## 2015-04-19 MED ORDER — PROPOFOL 10 MG/ML IV BOLUS
INTRAVENOUS | Status: DC | PRN
  Administered 2015-04-19 (×2): 10 mg via INTRAVENOUS

## 2015-04-19 MED ORDER — LACTATED RINGERS IV SOLN
INTRAVENOUS | Status: DC | PRN
  Administered 2015-04-19: 08:00:00 via INTRAVENOUS

## 2015-04-19 MED ORDER — LACTATED RINGERS IV SOLN
INTRAVENOUS | Status: DC
  Administered 2015-04-19: 07:00:00 via INTRAVENOUS

## 2015-04-19 MED ORDER — ENSURE ENLIVE PO LIQD: 237.0000 mL | Freq: Two times a day (BID) | ORAL | Status: DC

## 2015-04-19 MED ORDER — FENTANYL CITRATE (PF) 100 MCG/2ML IJ SOLN: 25.0000 ug | INTRAMUSCULAR | Status: DC | PRN

## 2015-04-19 MED ORDER — PROPOFOL INFUSION 10 MG/ML OPTIME
INTRAVENOUS | Status: DC | PRN
  Administered 2015-04-19: 50 ug/kg/min via INTRAVENOUS

## 2015-04-19 NOTE — Op Note (Signed)
[facility] [address] [place], [address]   ENDOSCOPY PROCEDURE REPORT  PATIENT: Tanner Owen, Tanner Owen  MR#: [id number] BIRTHDATE: [birth year] , 85  yrs. old GENDER: male ENDOSCOPIST: Rachael Fee, MD PROCEDURE DATE:  04/19/2015 PROCEDURE:  EGD w/ balloon dilation ASA CLASS:     Class IV INDICATIONS:  chronic GE junction stricture; dilated multiple times 2010-2011; most recent dilation 3 days ago up to 10mm however dysphagia still signficant. MEDICATIONS: Monitored anesthesia care TOPICAL ANESTHETIC: none  DESCRIPTION OF PROCEDURE: After the risks benefits and alternatives of the procedure were thoroughly explained, informed consent was obtained.  The Pentax Gastroscope [id number] endoscope was introduced through the mouth and advanced to the second portion of the duodenum , Without limitations.  The instrument was slowly withdrawn as the mucosa was fully examined.  The GE junction stricture was again located.  I could not advance the adult gastroscope through the stricture prior to dilation.  The lumen ws 7mm.  Using TTS balloons over a wire I dilated the stricture sequentially from 10mm, to 12mm, to 13.5 mm and then 15mm.  Following dilation there was the usual superficial mucosal tear and minor self limited oozing.  I was able to easily pass the gastroscope into the stomach following dilation.  This is clearly NOT a malignant stricture.  The UGI tract was otherwise essentially normal.  Retroflexed views revealed no abnormalities.     The scope was then withdrawn from the patient and the procedure completed.  COMPLICATIONS: There were no immediate complications.  ENDOSCOPIC IMPRESSION: The GE junction stricture was again located.  I could not advance the adult gastroscope through the stricture prior to dilation.  The lumen ws 7mm.  Using TTS balloons over a wire I dilated the stricture sequentially from 10mm, to 12mm, to 13.5 mm and then 15mm.   Following dilation there was the usual superficial mucosal tear and minor self limited oozing.  I was able to easily pass the gastroscope into the stomach following dilation.  This is clearly NOT a malignant stricture.  The UGI tract was otherwise essentially normal  RECOMMENDATIONS: Full liquids today (pushing fluids and at least 2-3 cans of ensure every day).  Ok to d/c tomorrow if he tolerates the above.  I don't think he should advance past soft food, purree consistency yet and obviously should chew any solid food very well, eat slowly and take small bites. [facility] office will contact him about repeat EGD/dilation in next few weeks.  eSigned:  Rachael Fee, MD 04/19/2015 8:00 AM    CC: Yancey Flemings, MD

## 2015-04-19 NOTE — Anesthesia Preprocedure Evaluation (Addendum)
Anesthesia Evaluation  Patient identified by MRN, date of birth, ID band Patient awake    Reviewed: Allergy & Precautions, NPO status   Airway Mallampati: II  TM Distance: >3 FB Neck ROM: Full    Dental  (+) Edentulous Upper, Edentulous Lower   Pulmonary former smoker,  breath sounds clear to auscultation        Cardiovascular hypertension, + Peripheral Vascular Disease Rhythm:Regular Rate:Normal     Neuro/Psych    GI/Hepatic GERD-  ,  Endo/Other  diabetes  Renal/GU Renal disease     Musculoskeletal   Abdominal   Peds  Hematology   Anesthesia Other Findings   Reproductive/Obstetrics                            Anesthesia Physical Anesthesia Plan  ASA: III  Anesthesia Plan: MAC   Post-op Pain Management:    Induction: Intravenous  Airway Management Planned: Simple Face Mask  Additional Equipment:   Intra-op Plan:   Post-operative Plan:   Informed Consent: I have reviewed the patients History and Physical, chart, labs and discussed the procedure including the risks, benefits and alternatives for the proposed anesthesia with the patient or authorized representative who has indicated his/her understanding and acceptance.   Dental advisory given  Plan Discussed with: CRNA and Anesthesiologist  Anesthesia Plan Comments:         Anesthesia Quick Evaluation

## 2015-04-19 NOTE — Consult Note (Signed)
Consultation Note Date: 04/19/2015   Patient Name: Tanner Owen  DOB: 09/12/1929  MRN: [id number]  Age / Sex: 81 y.o., male   PCP: Rodrigo Ran, MD Referring Physician: Leatha Gilding, MD  Reason for Consultation: Establishing goals of care  Palliative Care Assessment and Plan Summary of Established Goals of Care and Medical Treatment Preferences   Clinical Assessment/Narrative: Mr. Tanner Owen is an 81 year old male with PMHx hypertension, diabetes, chronic kidney disease and history of esophageal strictures (followed by Dr. Marina Goodell, gastroenterology), admitted due to complications of acute on chronic poor PO intake, weakness, nausea, and worsened dysphagia.  Chart review reveals that he was seen by Dr. Marina Goodell a month ago, and declined EGD at that time.  He underwent dilation on 8/2 with a repeat this morning.  Patient was still very sleepy from anesthesia on entering the room. He did arouse briefly, so immediately back to sleep after denying any complaints. His ex-wife was present in the room at the time.   His ex-wife reports that her husband is a "very stubborn man" and once he has made up his mind about something he is unlikely to change his mind. She states that he has told her and the rest the family that he will not consider a feeding tube. She reports that he says that he is "old ready to die."   When asked who else is important in helping him make medical decisions, she reports that her children are her ex-husband's next of kin and will be family that is involved in any decisions regarding his care. She does report that there is a somewhat strained relationship throughout the family due to her husband's past alcohol use and him being neglectful to the children in the past.  Much of conversation today was life review regarding her perception of interactions between her ex-husband in the children. She states while relationships have been strained in the past, her children are very invested  in ensuring that her husband is well taken care of in all respects.  She states that her ex-husband is been talking more about dying in the past several months. In fact, on a recent visit from his grandchildren, they took the opportunity to say goodbye to him as his family felt that they may not be back in the area to visit again before he dies.  - Spoke with patient's ex-wife who was serving as family presence for his procedure today. She states that she would not be involved in his medical care decisions, but does know the patient well. She is visiting just to help keep an eye on him for the sake of her children. - His ex-wife reports that he has been consistent in saying that he would not want a feeding tube. She also reports that he has been saying he is old and ready to die.  - She reports that her 2 sons will be in to visit tomorrow around 9 or 10 in the morning. She states that they would be the appropriate people to talk with in conjunction with the patient regarding his goals of care moving forward. - Plan to follow-up with patient and family tomorrow in order to check his progress with liquids after EGD as well as discuss further his goals of care moving forward.   Contacts/Participants in Discussion: Primary Decision Maker: Patient has been making his own medical decisions. He has 3 children who are next of kin.   HCPOA: no   Code Status/Advance Care Planning:  Full  code based on prior admission. Patient declined to discuss yesterday and is unable to do so today.  Symptom Management:   Dysphagia: Patient status post EGD with repeat this morning. Plan for trial of liquids per GI. Will continue to follow the next 24 hours to see if he is able to increase his overall by mouth intake. No other active symptoms or patient report.  Additional Recommendations (Limitations, Scope, Preferences): Patient appears to be consistent in saying he would not want a feeding tube under any  circumstances. I have not personally discussed this with him based upon his refusal to do so yesterday and decreased alertness following anesthesia today. Plan for follow-up tomorrow when sons are present and patient is further out from anesthesia in order to have a family meeting to discuss goals of care moving forward.  Psycho-social/Spiritual:   Support System: Family  Desire for further Chaplaincy support:no  Prognosis: Unable to determine this will depend greatly upon his ability to maintain nutrition. It is too soon after repeat EGD to determine how this may progress.  Discharge Planning:  Likely home. Support services to be determined based upon his goals of care       Chief Complaint/History of Present Illness: Mr. Tanner Owen is an 81 year old male with PMHx hypertension, diabetes, chronic kidney disease and history of esophageal strictures (followed by Dr. Marina Goodell, gastroenterology), admitted due to complications of acute on chronic poor PO intake, weakness, nausea, and worsened dysphagia.  Chart review reveals that he was seen by Dr. Marina Goodell a month ago, and declined EGD at that time.  He underwent dilation on 8/2 with a repeat this morning.   Primary Diagnoses  Present on Admission:  . Dehydration . Nausea and vomiting . Orthostasis . Hypokalemia . Chronic kidney disease (CKD), stage III (moderate) . Essential hypertension . Thrombocytopenia  Palliative Review of Systems: Asian status post anesthesia this morning and is unable to give review of systems. I have reviewed the medical record, interviewed the patient and family, and examined the patient. The following aspects are pertinent.  Past Medical History  Diagnosis Date  . B12 deficiency   . Hypertension   . Hyperlipidemia   . GERD (gastroesophageal reflux disease)   . Anemia   . Thrombocytopenia   . Microalbuminuria   . Renal artery stenosis   . Chronic kidney disease (CKD), stage III (moderate)   . Diabetes  mellitus   . Diverticulitis   . Esophageal stricture   . Gout    History   Social History  . Marital Status: Divorced    Spouse Name: N/A  . Number of Children: 3  . Years of Education: N/A   Occupational History  . Retired    Social History Main Topics  . Smoking status: Former Smoker -- 0.25 packs/day for 50 years    Types: Cigarettes    Quit date: 01/05/1989  . Smokeless tobacco: Never Used     Comment: stopped smoking in 1990  . Alcohol Use: No     Comment: quit drinking in 1990  . Drug Use: No  . Sexual Activity: Not on file   Other Topics Concern  . None   Social History Narrative   Family History  Problem Relation Age of Onset  . Stroke Mother    Scheduled Meds: . amLODipine  5 mg Oral Daily  . feeding supplement (ENSURE ENLIVE)  237 mL Oral TID  . hydrALAZINE  25 mg Oral 3 times per day  . insulin aspart  0-9  Units Subcutaneous TID WC  . propranolol  10 mg Oral BID   Continuous Infusions: . sodium chloride Stopped (04/19/15 0655)   PRN Meds:.acetaminophen, hydrALAZINE, HYDROmorphone (DILAUDID) injection, ondansetron Medications Prior to Admission:  Prior to Admission medications   Medication Sig Start Date End Date Taking? Authorizing Provider  acetaminophen (TYLENOL) 500 MG tablet Take 500 mg by mouth every 6 (six) hours as needed for moderate pain or headache.   Yes Historical Provider, MD  docusate sodium (COLACE) 100 MG capsule Take 100 mg by mouth daily.   Yes Historical Provider, MD  escitalopram (LEXAPRO) 10 MG tablet Take 10 mg by mouth daily.   Yes Historical Provider, MD  Omega-3 Fatty Acids (FISH OIL PO) Take 5 mLs by mouth daily.   Yes Historical Provider, MD  omeprazole (PRILOSEC) 20 MG capsule TAKE TWO CAPSULES BY MOUTH DAILY Patient taking differently: TAKE ONE CAPSULES BY MOUTH DAILY 03/28/12  Yes Hilarie Fredrickson, MD  propranolol (INDERAL) 80 MG tablet Take 40 mg by mouth 2 (two) times daily.  12/26/11  Yes Historical Provider, MD    cephALEXin (KEFLEX) 500 MG capsule Take 1 capsule (500 mg total) by mouth 4 (four) times daily. Patient not taking: Reported on 04/14/2015 04/05/15   Roxy Horseman, PA-C   No Known Allergies CBC:    Component Value Date/Time   WBC 7.0 04/18/2015 0448   WBC 5.3 06/04/2011 1037   HGB 10.9* 04/19/2015 0716   HGB 11.6* 06/04/2011 1037   HCT 32.0* 04/19/2015 0716   HCT 33.6* 06/04/2011 1037   PLT 103* 04/18/2015 0448   PLT 100 Large platelets present* 06/04/2011 1037   MCV 87.9 04/18/2015 0448   MCV 81.4 06/04/2011 1037   NEUTROABS 3.9 04/05/2015 2036   NEUTROABS 3.6 06/04/2011 1037   LYMPHSABS 0.3* 04/05/2015 2036   LYMPHSABS 1.1 06/04/2011 1037   MONOABS 0.2 04/05/2015 2036   MONOABS 0.5 06/04/2011 1037   EOSABS 0.0 04/05/2015 2036   EOSABS 0.1 06/04/2011 1037   BASOSABS 0.0 04/05/2015 2036   BASOSABS 0.0 06/04/2011 1037   Comprehensive Metabolic Panel:    Component Value Date/Time   NA 142 04/19/2015 0716   K 4.4 04/19/2015 0716   CL 107 04/18/2015 0448   CO2 23 04/18/2015 0448   BUN 22* 04/18/2015 0448   CREATININE 1.28* 04/18/2015 0448   GLUCOSE 87 04/19/2015 0716   CALCIUM 8.3* 04/18/2015 0448   AST 48* 04/05/2015 2036   ALT 28 04/05/2015 2036   ALKPHOS 42 04/05/2015 2036   BILITOT 2.4* 04/05/2015 2036   PROT 6.7 04/05/2015 2036   ALBUMIN 3.7 04/05/2015 2036    Physical Exam: Vital Signs: BP 165/63 mmHg  Pulse 55  Temp(Src) 98 F (36.7 C) (Oral)  Resp 16  Ht 5\' 11"  (1.803 m)  Wt 67.541 kg (148 lb 14.4 oz)  BMI 20.78 kg/m2  SpO2 99% SpO2: SpO2: 99 % O2 Device: O2 Device: Not Delivered O2 Flow Rate: O2 Flow Rate (L/min): 3 L/min Intake/output summary:  Intake/Output Summary (Last 24 hours) at 04/19/15 1212 Last data filed at 04/19/15 0700  Gross per 24 hour  Intake   1680 ml  Output   1400 ml  Net    280 ml   LBM: Last BM Date: 04/18/15 Baseline Weight: Weight: 60.464 kg (133 lb 4.8 oz) Most recent weight: Weight: 67.541 kg (148 lb 14.4  oz)  Exam Findings:  Gen. elderly mal,e thin and chronically ill-appearing, lying in bed, snoring in no acute distress ENT: Mucous membranes  tacky, no scleral icterus, or discharge noted Neck: Supple with no lymphadenopathy Respiratory: Coarse upper airway sounds related to snoring. Lungs clear to auscultation bilaterally with anterior auscultation only Cardiovascular regular with no murmur appreciated Abdomen: Soft nontender nondistended Musculoskeletal: Some muscle wasting noted. No significant edema.         Palliative Performance Scale: 40%             Additional Data Reviewed: Recent Labs     04/17/15  0533  04/18/15  0448  04/19/15  0716  WBC  9.1  7.0   --   HGB  11.9*  10.9*  10.9*  PLT  114*  103*   --   NA  141  138  142  BUN  21*  22*   --   CREATININE  1.22  1.28*   --      Time In: 1050 Time Out: 1145 Time Total: 60 Greater than 50%  of this time was spent counseling and coordinating care related to the above assessment and plan.  Signed by: Romie Minus, MD  Romie Minus, MD  04/19/2015, 12:12 PM  Please contact Palliative Medicine Team phone at [phone] for questions and concerns.

## 2015-04-19 NOTE — Transfer of Care (Signed)
Immediate Anesthesia Transfer of Care Note  Patient: Tanner Owen  Procedure(s) Performed: Procedure(s): ESOPHAGOGASTRODUODENOSCOPY (EGD) WITH PROPOFOL (N/A)  Patient Location: Endoscopy Unit  Anesthesia Type:MAC  Level of Consciousness: sedated  Airway & Oxygen Therapy: Patient Spontanous Breathing and Patient connected to nasal cannula oxygen  Post-op Assessment: Report given to RN, Post -op Vital signs reviewed and stable and Patient moving all extremities X 4  Post vital signs: Reviewed and stable  Last Vitals:  Filed Vitals:   04/19/15 0716  BP: 207/66  Pulse:   Temp: 36.7 C  Resp:     Complications: No apparent anesthesia complications

## 2015-04-19 NOTE — Anesthesia Postprocedure Evaluation (Signed)
  Anesthesia Post-op Note  Patient: Tanner Owen  Procedure(s) Performed: Procedure(s): ESOPHAGOGASTRODUODENOSCOPY (EGD) WITH PROPOFOL (N/A)  Patient Location: PACU and Endoscopy Unit  Anesthesia Type:MAC  Level of Consciousness: awake  Airway and Oxygen Therapy: Patient Spontanous Breathing  Post-op Pain: mild  Post-op Assessment: Post-op Vital signs reviewed              Post-op Vital Signs: Reviewed  Last Vitals:  Filed Vitals:   04/19/15 1137  BP: 165/63  Pulse:   Temp:   Resp:     Complications: No apparent anesthesia complications

## 2015-04-19 NOTE — Interval H&P Note (Signed)
History and Physical Interval Note:  04/19/2015 7:28 AM  Tanner Owen  has presented today for surgery, with the diagnosis of dysphagia, esophageal stricture  The various methods of treatment have been discussed with the patient and family. After consideration of risks, benefits and other options for treatment, the patient has consented to  Procedure(s): ESOPHAGOGASTRODUODENOSCOPY (EGD) WITH PROPOFOL (N/A) as a surgical intervention .  The patient's history has been reviewed, patient examined, no change in status, stable for surgery.  I have reviewed the patient's chart and labs.  Questions were answered to the patient's satisfaction.     Rachael Fee

## 2015-04-19 NOTE — Progress Notes (Signed)
PROGRESS NOTE  Tanner Owen:[id number] DOB: October 23, 1928 DOA: 04/13/2015 PCP: Ezequiel Kayser, MD  HPI: Patient is a 81 year old male with hypertension, diabetes, chronic kidney disease and history of esophageal strictures seen by gastroenterology, presented with acute on chronic poor by mouth intake, dysphagia as well as nausea. He was seen recently by Dr. Marina Goodell his gastroenterologist about a month ago, offered an EGD however patient wanted to see how he is doing on his own. He was admitted with dehydration and weakness in the setting of being unable to eat or drink appropriately for a week.   Subjective / 24 H Interval events -  He is feeling well, just came back from the endoscopy, whishes to be left alone  Assessment/Plan: Principal Problem:   Dehydration Active Problems:   ESOPHAGEAL STRICTURE   Orthostasis   Nausea and vomiting   Hypokalemia   Chronic kidney disease (CKD), stage III (moderate)   Diabetes mellitus   Essential hypertension   Thrombocytopenia   Protein-calorie malnutrition, severe   Goals of care, counseling/discussion  Esophageal stricture - Gastroenterology consulted, EGD with dilatation done twice, on 8/3 as well as 8/5,  Maintain on full liquid diet - continue to encourage PO intake - discussed with patient and patient's son 8/4. Patient is telling me that he is quite tired of all this. His son is asking about hospice on whether that would be an option, but wants to think and discuss more about his father's future, on whether he would want a feeding tube or not and so on. - Consulted palliative care, appreciate input  Urinary retention - foley catheter   FTT - due to esophageal stricture  Severe protein calorie malnutrition - consult nutrition  Hypertension  - with hypertensive urgency on 8/1 - blood pressure continues to remain elevated  - He is asymptomatic, no chest pain or headaches or blurry vision - Suspect he has a component of chronic  elevated blood pressures and also in his previous office visits as well as his most recent ED visit his blood pressure was in the 170s systolic - Continue propranolol to 10 mg twice a day from 40 mg twice a day and Norvasc 5 mg,  Continue hydralazine - Closely monitor blood pressure  Bradycardia - Monitor on telemetry. - Asymptomatic, rates better after decreasing propranolol  Dehydration with orthostatics - Continue hydration with IV fluids as he is not having good by mouth intake.  Chronic kidney disease stage III - Creatinine is stable at baseline, continue to monitor  Diabetes mellitus - Hemoglobin A1c is 6.8. - Continue sliding scale coverage  Hypokalemia - Likely in the setting of poor by mouth intake - replete   Thrombocytopenia - Acute on chronic, dating as far back as 2012 - Platelets overall stable; No evidence of bleeding, continue to monitor   Diet: Diet full liquid Room service appropriate?: Yes; Fluid consistency:: Thin Fluids: NS 50 cc/h DVT Prophylaxis: SCDs  Code Status: Prior Family Communication: discussed with ex wife bedside Disposition Plan: remain inpatient  Consultants:  GI  Procedures:  EGD 8/2   Antibiotics  Anti-infectives    None     Objective  Filed Vitals:   04/19/15 0815 04/19/15 0849 04/19/15 1008 04/19/15 1137  BP: 166/51 211/67 149/64 165/63  Pulse: 52 55    Temp:  98 F (36.7 C)    TempSrc:  Oral    Resp: 15 16    Height:      Weight:  SpO2: 97% 99%      Intake/Output Summary (Last 24 hours) at 04/19/15 1226 Last data filed at 04/19/15 0700  Gross per 24 hour  Intake   1680 ml  Output   1400 ml  Net    280 ml   Filed Weights   04/16/15 2028 04/17/15 2044 04/18/15 2026  Weight: 64.91 kg (143 lb 1.6 oz) 67.405 kg (148 lb 9.6 oz) 67.541 kg (148 lb 14.4 oz)    Exam:  GENERAL: Elderly is lying supine in bed, appears weak in NAD  HEENT: Pupils are equal, round, and reactive to light and accommodation.  Mouth is dry without lesions.   LUNGS: Clear to auscultation, good air movement. No wheezing or crackles.  HEART: Regular rate and rhythm without murmur. 2+ peripheral pulses, no peripheral edema  ABDOMEN: Soft, non-tender, and non-distended. No guarding. +BS.  EXTREMITIES: No clubbing, cyanosis, or joint swelling noted.  NEUROLOGIC: non focal, strength 4/5 in all 4.  SKIN: No ulceration or induration present.  Data Reviewed: Basic Metabolic Panel:  Recent Labs Lab 04/14/15 0434 04/15/15 0455 04/16/15 0620 04/17/15 0533 04/18/15 0448 04/19/15 0716  NA 140 141 140 141 138 142  K 3.4* 3.0* 3.2* 3.0* 2.7* 4.4  CL 110 113* 109 110 107  --   CO2 20* 23 21* 22 23  --   GLUCOSE 151* 90 74 86 91 87  BUN 29* 28* 27* 21* 22*  --   CREATININE 1.43* 1.46* 1.41* 1.22 1.28*  --   CALCIUM 8.7* 8.4* 8.5* 8.6* 8.3*  --   MG  --   --   --   --  1.6*  --    CBC:  Recent Labs Lab 04/14/15 0434 04/15/15 0455 04/16/15 0620 04/17/15 0533 04/18/15 0448 04/19/15 0716  WBC 12.0* 6.0 5.8 9.1 7.0  --   HGB 12.6* 9.9* 10.9* 11.9* 10.9* 10.9*  HCT 36.1* 28.1* 31.9* 34.1* 31.2* 32.0*  MCV 87.2 87.8 90.4 88.8 87.9  --   PLT 189 104* 102* 114* 103*  --    CBG:  Recent Labs Lab 04/18/15 2025 04/19/15 0019 04/19/15 0401 04/19/15 0841 04/19/15 1145  GLUCAP 114* 94 81 84 85    Scheduled Meds: . amLODipine  5 mg Oral Daily  . feeding supplement (ENSURE ENLIVE)  237 mL Oral TID  . hydrALAZINE  25 mg Oral 3 times per day  . insulin aspart  0-9 Units Subcutaneous TID WC  . propranolol  10 mg Oral BID   Continuous Infusions: . sodium chloride Stopped (04/19/15 4098)     Pamella Pert, MD [facility] Pager [phone]. If 7 PM - 7 AM, please contact night-coverage at [email or web address], password [facility] 04/19/2015, 12:26 PM  LOS: 5 days

## 2015-04-19 NOTE — H&P (View-Only) (Signed)
[facility] Progress Note    Since last GI note: S/p dilation with EGD, balloon yesterday.  He's keeping down water, keeping down ensure. It takes him a long time to drink these.  He is hard of hearing but it seems like he wants to be left alone.  Says "I'm 81".  Objective: Vital signs in last 24 hours: Temp:  [97.6 F (36.4 C)-98.9 F (37.2 C)] 98.9 F (37.2 C) (08/04 0431) Pulse Rate:  [59-72] 62 (08/04 0431) Resp:  [16-18] 18 (08/04 0431) BP: (149-192)/(57-80) 180/73 mmHg (08/04 0431) SpO2:  [97 %] 97 % (08/04 0431) Weight:  [148 lb 9.6 oz (67.405 kg)] 148 lb 9.6 oz (67.405 kg) (08/03 2044) Last BM Date: 04/17/15 General: alert and oriented times 3, frail, elderly Heart: regular rate and rythm Abdomen: soft, non-tender, non-distended, normal bowel sounds   Lab Results:  Recent Labs  04/16/15 0620 04/17/15 0533 04/18/15 0448  WBC 5.8 9.1 7.0  HGB 10.9* 11.9* 10.9*  PLT 102* 114* 103*  MCV 90.4 88.8 87.9    Recent Labs  04/16/15 0620 04/17/15 0533 04/18/15 0448  NA 140 141 138  K 3.2* 3.0* 2.7*  CL 109 110 107  CO2 21* 22 23  GLUCOSE 74 86 91  BUN 27* 21* 22*  CREATININE 1.41* 1.22 1.28*  CALCIUM 8.5* 8.6* 8.3*    Medications: Scheduled Meds: . amLODipine  5 mg Oral Daily  . feeding supplement (ENSURE ENLIVE)  237 mL Oral TID  . hydrALAZINE  10 mg Oral 3 times per day  . insulin aspart  0-9 Units Subcutaneous TID WC  . potassium chloride  40 mEq Oral Q4H  . propranolol  10 mg Oral BID   Continuous Infusions: . sodium chloride 50 mL/hr at 04/16/15 2117   PRN Meds:.acetaminophen, hydrALAZINE, HYDROmorphone (DILAUDID) injection, ondansetron    Assessment/Plan: 81 y.o. male with GE junction stricture  He is keeping down liquids but having to really struggle (took hours to finish his 2-3 cans of ensure).  I recommended we repeat EGD, dilation this AM.  I brought up the idea of feeding tube with family (ex-wife) in the room yesterday.  I  mentioned it today to the patient, he was not very receptive to the idea.   Rachael Fee, MD  04/18/2015, 7:20 AM [facility] Pager [phone]    Addendum: The EGD was cancelled by anesthesia for K 2.7. I spoke with his ex-wife about this. Planning for repeat attempt tomorrow.  I also recommended that they consider feeding tube (would likely be via IR since stricture is so tight).  She explained that he has asked about letting him die instead, he is "ready to go."  I will leave that decision to family however I think his main issue is that is is unable to get adequate nutrition and probably hydration and this could probably be remedied with feeding tube. Would continue to try to dilate the presumably benign GE junction stricture.  Should consider palliative care consult.

## 2015-04-20 DIAGNOSIS — N183 Chronic kidney disease, stage 3 unspecified: Secondary | ICD-10-CM | POA: Diagnosis present

## 2015-04-20 DIAGNOSIS — Z515 Encounter for palliative care: Secondary | ICD-10-CM | POA: Diagnosis present

## 2015-04-20 DIAGNOSIS — R339 Retention of urine, unspecified: Secondary | ICD-10-CM

## 2015-04-20 DIAGNOSIS — R001 Bradycardia, unspecified: Secondary | ICD-10-CM

## 2015-04-20 DIAGNOSIS — R630 Anorexia: Secondary | ICD-10-CM | POA: Diagnosis present

## 2015-04-20 DIAGNOSIS — K222 Esophageal obstruction: Secondary | ICD-10-CM | POA: Diagnosis present

## 2015-04-20 DIAGNOSIS — E119 Type 2 diabetes mellitus without complications: Secondary | ICD-10-CM | POA: Diagnosis present

## 2015-04-20 DIAGNOSIS — R627 Adult failure to thrive: Secondary | ICD-10-CM | POA: Diagnosis present

## 2015-04-20 DIAGNOSIS — E43 Unspecified severe protein-calorie malnutrition: Secondary | ICD-10-CM | POA: Diagnosis present

## 2015-04-20 DIAGNOSIS — R1314 Dysphagia, pharyngoesophageal phase: Secondary | ICD-10-CM | POA: Diagnosis present

## 2015-04-20 LAB — CBC
HCT: 31.1 % — ABNORMAL LOW (ref 39.0–52.0)
Hemoglobin: 10.8 g/dL — ABNORMAL LOW (ref 13.0–17.0)
MCH: 31.2 pg (ref 26.0–34.0)
MCHC: 34.7 g/dL (ref 30.0–36.0)
MCV: 89.9 fL (ref 78.0–100.0)
Platelets: 73 K/uL — ABNORMAL LOW (ref 150–400)
RBC: 3.46 MIL/uL — ABNORMAL LOW (ref 4.22–5.81)
RDW: 14.8 % (ref 11.5–15.5)
WBC: 10.6 K/uL — ABNORMAL HIGH (ref 4.0–10.5)

## 2015-04-20 LAB — BASIC METABOLIC PANEL
ANION GAP: 8 (ref 5–15)
BUN: 20 mg/dL (ref 6–20)
CO2: 24 mmol/L (ref 22–32)
Calcium: 8 mg/dL — ABNORMAL LOW (ref 8.9–10.3)
Chloride: 106 mmol/L (ref 101–111)
Creatinine, Ser: 1.24 mg/dL (ref 0.61–1.24)
GFR calc Af Amer: >60 mL/min (ref >60)
GFR calc non Af Amer: 52 mL/min — ABNORMAL LOW (ref >60)
Glucose, Bld: 80 mg/dL (ref 65–99)
POTASSIUM: 3.1 mmol/L — AB (ref 3.5–5.1)
SODIUM: 138 mmol/L (ref 135–145)

## 2015-04-20 LAB — POTASSIUM: Potassium: 3.2 mmol/L — ABNORMAL LOW (ref 3.5–5.1)

## 2015-04-20 LAB — GLUCOSE, CAPILLARY
Glucose-Capillary: 147 mg/dL — ABNORMAL HIGH (ref 65–99)
Glucose-Capillary: 79 mg/dL (ref 65–99)
Glucose-Capillary: 81 mg/dL (ref 65–99)
Glucose-Capillary: 83 mg/dL (ref 65–99)
Glucose-Capillary: 84 mg/dL (ref 65–99)
Glucose-Capillary: 85 mg/dL (ref 65–99)
Glucose-Capillary: 94 mg/dL (ref 65–99)

## 2015-04-20 LAB — MAGNESIUM: Magnesium: 2 mg/dL (ref 1.7–2.4)

## 2015-04-20 MED ORDER — MEGESTROL ACETATE 400 MG/10ML PO SUSP
800.0000 mg | Freq: Every day | ORAL | Status: DC
  Administered 2015-04-20 – 2015-04-23 (×4): 800 mg via ORAL
  Filled 2015-04-20 (×4): qty 20

## 2015-04-20 MED ORDER — POTASSIUM CHLORIDE 20 MEQ/15ML (10%) PO SOLN
40.0000 meq | Freq: Once | ORAL | Status: AC
  Administered 2015-04-20: 40 meq via ORAL
  Filled 2015-04-20: qty 30

## 2015-04-20 MED ORDER — AMLODIPINE BESYLATE 10 MG PO TABS
10.0000 mg | ORAL_TABLET | Freq: Every day | ORAL | Status: DC
  Administered 2015-04-20 – 2015-04-23 (×4): 10 mg via ORAL
  Filled 2015-04-20 (×4): qty 1

## 2015-04-20 MED ORDER — HYDRALAZINE HCL 25 MG PO TABS
35.0000 mg | ORAL_TABLET | Freq: Three times a day (TID) | ORAL | Status: DC
  Administered 2015-04-20 – 2015-04-22 (×7): 35 mg via ORAL
  Filled 2015-04-20 (×15): qty 1

## 2015-04-20 NOTE — Progress Notes (Signed)
Daily Progress Note   Patient Name: Tanner Owen       Date: 04/20/2015 DOB: 1928/11/09  Age: 81 y.o. MRN#: [id number] Attending Physician: Allie Bossier, MD Primary Care Physician: Jerlyn Ly, MD Admit Date: 04/13/2015  Reason for Consultation/Follow-up: Establishing goals of care  Subjective: I met with patient and his son to discuss goals of care moving forward.  Tanner Owen reports that he is hopeful that his swallowing will improve after having his repeat EGD yesterday. He feels much better when he is trying to take in liquids today. He reports that if this is not the case that he will "deal with it" when he gets home.  Speaking to him, he reports that his major concerns are being able to spend time with his family. He reports he is "an old man" and is not concerned if he is approaching the end of his life. He states moving forward he really wants to focus on being able to spend quality time with his family and he is not wanting to return to the hospital.  I spoke with his son, Tanner Owen, at length about these wishes. He reports this is very consistent with his father's statements and thoughts about end-of-life in the past. At the same time, however, he states that he does not want to give up on his father being able to get better.  I relayed that I thought that this was a very reasonable way to approach his care. In exploring what Tanner Owen wishes are, we discussed that a plan will be consistent with his wishes would be to discharge home today as previously planned. As his last dilation lasted for long period of time, his son is hopeful that his father will rebound on arrival back home. If it does not, however, his son is in agreement that his father's wishes would be to refocus his care in order to allow him to remain at home. With his permission, we discussed home hospice being option may be good to be involved in his care if his nutritional status continues to decline once he is  home.  His son was very receptive to this, and the patient appeared to be listening. He later reported he did not follow the entire conversation. I attempted to review our conversation with him, but he reports that he did not want to discuss further at this time. His son reports he thinks he can hear more than he lets on and that he probably followed the conversation.  I did speak with the patient about plan to see how he does at home with close follow-up with his PCP Tanner Owen. He is in agreement that "Tanner Owen will know what to do."  He did also agree again that his overall goals are to remain comfortable, spend time with his family, focus on his faith, and remain at home. His son agreed that if his father gets home and continues to decline, he would likely be best served by pursuing home hospice. He reports that he will be off the next couple of weeks in order to stay with his father, but he can also retired anytime if needed to take care of him full-time. Patient's other son also lives in the same home and is there whenever he is not at work. It appears that the patient will have good social support and caregiving after discharge to his home.  We also specifically discussed a feeding tube and the patient was adamant that this is  not something he would want. He reports that he is unable to maintain himself with oral intake, he would rather focus on being comfortable than pursue nutrition via tube. Attempted to discuss his CODE STATUS as well, but the patient reported he was done talking about such things today. Based upon conversation, it appears that do not resuscitate would best most in line with his wishes, however the patient did not want to discuss further today. He reports that this is also something he will talk with his PCP about in follow-up. I did discuss with his son, Tanner Owen, regarding recommendation for completion of a durable DNR if his father's wishes are in fact not to have heroic measures  performed the time of death. He reports understanding and that he will discuss this with Tanner Owen at follow-up.  Length of Stay: 6 days  Current Medications: Scheduled Meds:  . amLODipine  5 mg Oral Daily  . feeding supplement (ENSURE ENLIVE)  237 mL Oral TID  . hydrALAZINE  25 mg Oral 3 times per day  . insulin aspart  0-9 Units Subcutaneous TID WC  . potassium chloride  40 mEq Oral Once  . propranolol  10 mg Oral BID    Continuous Infusions: . sodium chloride 50 mL/hr at 04/19/15 2004    PRN Meds: acetaminophen, hydrALAZINE, HYDROmorphone (DILAUDID) injection, ondansetron  Palliative Performance Scale: 40%     Vital Signs: BP 163/60 mmHg  Pulse 59  Temp(Src) 98.4 F (36.9 C) (Oral)  Resp 16  Ht 5' 11"  (1.803 m)  Wt 65.545 kg (144 lb 8 oz)  BMI 20.16 kg/m2  SpO2 96% SpO2: SpO2: 96 % O2 Device: O2 Device: Not Delivered O2 Flow Rate: O2 Flow Rate (L/min): 3 L/min  Intake/output summary:  Intake/Output Summary (Last 24 hours) at 04/20/15 0926 Last data filed at 04/20/15 0700  Gross per 24 hour  Intake  712.5 ml  Output    900 ml  Net -187.5 ml   LBM:   Baseline Weight: Weight: 60.464 kg (133 lb 4.8 oz) Most recent weight: Weight: 65.545 kg (144 lb 8 oz)  Physical Exam: Gen. elderly male, thin and chronically ill-appearing, lying in bed, awake, conversational, and in no acute distress ENT: Mucous membranes tacky, no scleral icterus, or discharge noted Neck: Supple with no lymphadenopathy Respiratory:  Lungs clear to auscultation bilaterally with anterior auscultation only Cardiovascular regular with no murmur appreciated Abdomen: Soft nontender nondistended Musculoskeletal: Some muscle wasting noted. No significant edema.            Additional Data Reviewed: Recent Labs     04/18/15  0448  04/19/15  0716  04/20/15  0437  WBC  7.0   --   10.6*  HGB  10.9*  10.9*  10.8*  PLT  103*   --   73*  NA  138  142  138  BUN  22*   --   20  CREATININE  1.28*    --   1.24     Problem List:  Patient Active Problem List   Diagnosis Date Noted  . Protein-calorie malnutrition, severe 04/18/2015  . Goals of care, counseling/discussion   . Dehydration 04/14/2015  . Nausea and vomiting 04/14/2015  . Hypokalemia 04/14/2015  . Chronic kidney disease (CKD), stage III (moderate) 04/14/2015  . Diabetes mellitus 04/14/2015  . Essential hypertension 04/14/2015  . Thrombocytopenia 04/14/2015  . Syncope 01/07/2012  . Orthostasis 01/07/2012  . ARF (acute renal failure) 01/07/2012  . UTI (lower urinary  tract infection) 01/07/2012  . Hyponatremia 01/07/2012  . ESOPHAGEAL STRICTURE 05/07/2009     Palliative Care Assessment & Plan    Code Status:  Full code  Goals of Care:  Please see full note above for details conversation. Tanner Owen and his son are hopeful that he will continue to improve following EGD with dilation. If not, his wishes would be to refocus care in order to be able to be at home. We discussed that an option that may be good support for them if his nutritional status continues to decline would be home hospice support. Tanner Owen son was receptive to this, and they will plan to follow up with his PCP to help determine his clinical course following his discharge. I will call and discuss our conversation with Tanner Owen on Monday in order to make sure he is up-to-date on conversation.  Symptom Management:  Dysphagia: Appears to be improved status post dilation. Continued to monitor closely as patient is at high risk for nutritional decline. For follow-up as outpatient with PCP.   Psycho-social/Spiritual:  Desire for further Chaplaincy support:no   Prognosis: Unable to determine as his future nutritional status is yet to be determined. Discharge Planning: Likely discharge home today. He does have significant help at home as his son will be off the next 2 weeks in order to help care for him.   Care plan was discussed with  patient, his son Tanner Owen, and Dr. Sherral Hammers.  Thank you for allowing the Palliative Medicine Team to assist in the care of this patient.   Time In: 0830 Time Out: 0910 Total Time 45 Prolonged Time Billed  no     Greater than 50%  of this time was spent counseling and coordinating care related to the above assessment and plan.   Micheline Rough, MD  04/20/2015, 9:26 AM  Please contact Palliative Medicine Team phone at [phone] for questions and concerns.

## 2015-04-20 NOTE — Progress Notes (Signed)
[facility] PROGRESS NOTE  Tanner Owen ZOX:[id number] DOB: [birth year] DOA: 04/13/2015 PCP: Ezequiel Kayser, MD HPI/Subjective: 81 y.o. WM PMHx HTN, DM Type 2, Hyperlipidemia, Stage III CKD, Renal Artery Stenosis, anemia, thrombocytopenia, diverticulitis, Esophageal Stricture S/P Dilatitions in the past  Presents to the Ed with complaints of Nausea and vomiting and ABD Pain x 10 days and being unable to hold down foods and liquids. He has had poor intake of foods and liquids over the past 3 weeks according to his son. In the ED, he was found to be orthostatic, as well as having and increase in his BUN/Cr. He was referred for admission.  8/6 A/O 4, NAD, negative N/V, negative abdominal pain, negative CP, negative SOB. Patient states that he is not eating because he has no appetite.     Assessment/Plan: Esophageal stricture - Gastroenterology consulted, EGD with dilatation done twice, on 8/3 as well as 8/5, Maintain on full liquid diet - continue to encourage PO intake   Anorexia -Start Megace 800 mg daily  Urinary retention - foley catheter   FTT - due to esophageal stricture -Patient states negative appetite partially secondary to hospital food  Severe protein calorie malnutrition - Per nutrition continue ensure TID  Essential Hypertension  -Continue propranolol to 10 mg twice  -Increase Norvasc , -Increase hydralazine to 35 mg TID -Obtain orthostatic vitals.  Bradycardia - Monitor on telemetry. - Asymptomatic, rates better after decreasing propranolol  Dehydration with orthostatics - Continue hydration normal saline 50 male /hr   Chronic kidney disease stage III - Creatinine is stable at baseline, continue to monitor  Diabetes mellitus controlled - 7/31 hemoglobin A1c = 6.8. - Continue sliding scale coverage  Hypokalemia - Likely in the setting of poor by mouth intake - Potassium 40 mEq 1 -Recheck K/magnesium at  1400  Thrombocytopenia - Acute on chronic, dating as far back as 2012 - Platelets trending down however no signs of overt bleeding; most likely secondary to acute illness. We'll continue to monitor closely for signs of bleeding.    Code Status: Full Family Communication: Son present Disposition Plan: Discharge in next 24-48 hours   Consultants: Dr.Daniel Marye Round (GI)   Procedures: 8/3 and 8/5;EGD with dilatation     Cultures NA  Antibiotics: NA   DVT prophylaxis SCD    Objective: Filed Vitals:   04/19/15 1607 04/19/15 2036 04/20/15 0443 04/20/15 1047  BP: 153/62 164/66 163/60 159/63  Pulse: 57 64 59 61  Temp: 98.7 F (37.1 C) 98.2 F (36.8 C) 98.4 F (36.9 C) 97.8 F (36.6 C)  TempSrc: Oral Oral Oral Oral  Resp: Height:      Weight:  65.545 kg (144 lb 8 oz)    SpO2: 96% 96% 96% 96%    Intake/Output Summary (Last 24 hours) at 04/20/15 1052 Last data filed at 04/20/15 1047  Gross per 24 hour  Intake  832.5 ml  Output    900 ml  Net  -67.5 ml   Filed Weights   04/17/15 2044 04/18/15 2026 04/19/15 2036  Weight: 67.405 kg (148 lb 9.6 oz) 67.541 kg (148 lb 14.4 oz) 65.545 kg (144 lb 8 oz)     Exam: General:  A/O 4, NAD,No acute respiratory distress, cachectic Eyes: Negative headache, eye pain, double vision,negative scleral hemorrhage ENT: Negative Runny nose, negative ear pain, negative tinnitus, negative gingival bleeding, Neck:  Negative scars, masses, torticollis, lymphadenopathy, JVD Lungs: Clear to auscultation bilaterally without wheezes or crackles Cardiovascular: Regular  rate and rhythm without murmur gallop or rub normal S1 and S2 Abdomen:negative abdominal pain, negative dysphagia, nondistended, positive soft, bowel sounds, no rebound, no ascites, no appreciable mass Extremities: No significant cyanosis, clubbing, or edema bilateral lower extremities Psychiatric:  Negative depression, negative anxiety, negative fatigue,  negative mania Neurologic:  Cranial nerves II through XII intact, tongue/uvula midline, all extremities muscle strength 5/5, sensation intact throughout, negative dysarthria, negative expressive aphasia, negative receptive aphasia.     Data Reviewed: Basic Metabolic Panel:  Recent Labs Lab 04/15/15 0455 04/16/15 1610 04/17/15 0533 04/18/15 0448 04/19/15 0716 04/20/15 0437  NA 141 140 141 138 142 138  K 3.0* 3.2* 3.0* 2.7* 4.4 3.1*  CL 113* 109 110 107  --  106  CO2 23 21* 22 23  --  24  GLUCOSE 90 74 86 91 87 80  BUN 28* 27* 21* 22*  --  20  CREATININE 1.46* 1.41* 1.22 1.28*  --  1.24  CALCIUM 8.4* 8.5* 8.6* 8.3*  --  8.0*  MG  --   --   --  1.6*  --   --    Liver Function Tests: No results for input(s): AST, ALT, ALKPHOS, BILITOT, PROT, ALBUMIN in the last 168 hours. No results for input(s): LIPASE, AMYLASE in the last 168 hours. No results for input(s): AMMONIA in the last 168 hours. CBC:  Recent Labs Lab 04/15/15 0455 04/16/15 0620 04/17/15 0533 04/18/15 0448 04/19/15 0716 04/20/15 0437  WBC 6.0 5.8 9.1 7.0  --  10.6*  HGB 9.9* 10.9* 11.9* 10.9* 10.9* 10.8*  HCT 28.1* 31.9* 34.1* 31.2* 32.0* 31.1*  MCV 87.8 90.4 88.8 87.9  --  89.9  PLT 104* 102* 114* 103*  --  73*   Cardiac Enzymes: No results for input(s): CKTOTAL, CKMB, CKMBINDEX, TROPONINI in the last 168 hours. BNP (last 3 results) No results for input(s): BNP in the last 8760 hours.  ProBNP (last 3 results) No results for input(s): PROBNP in the last 8760 hours.  CBG:  Recent Labs Lab 04/19/15 1605 04/19/15 1952 04/19/15 2358 04/20/15 0409 04/20/15 0752  GLUCAP 94 77 81 83 79    No results found for this or any previous visit (from the past 240 hour(s)).   Studies: No results found.  Scheduled Meds: . amLODipine  10 mg Oral Daily  . feeding supplement (ENSURE ENLIVE)  237 mL Oral TID  . hydrALAZINE  35 mg Oral 3 times per day  . insulin aspart  0-9 Units Subcutaneous TID WC  .  megestrol  800 mg Oral Daily  . potassium chloride  40 mEq Oral Once  . propranolol  10 mg Oral BID   Continuous Infusions: . sodium chloride 50 mL/hr at 04/19/15 2004    Principal Problem:   Dehydration Active Problems:   ESOPHAGEAL STRICTURE   Orthostasis   Nausea and vomiting   Hypokalemia   Chronic kidney disease (CKD), stage III (moderate)   Diabetes mellitus   Essential hypertension   Thrombocytopenia   Protein-calorie malnutrition, severe   Goals of care, counseling/discussion   Dysphagia, pharyngoesophageal phase   Palliative care encounter   Palliative care by specialist   Esophageal stricture   Anorexia   Urinary retention   Failure to thrive in adult   Severe protein-calorie malnutrition   Bradycardia   CKD (chronic kidney disease), stage III   Diabetes type 2, controlled    Time spent: 40 minutes    WOODS, CURTIS J  [facility] Pager [phone]. If  7PM-7AM, please contact night-coverage at [email or web address], password [facility] 04/20/2015, 10:52 AM  LOS: 6 days    Care during the described time interval was provided by me .  I have reviewed this patient's available data, including medical history, events of note, physical examination, and all test results as part of my evaluation. I have personally reviewed and interpreted all radiology studies.   Carolyne Littles, MD [phone] Pager

## 2015-04-20 NOTE — Progress Notes (Signed)
[facility] Progress Note    Since last GI note: Repeat EGD yesterday, dilated up to 15mm.  RN says he's able to take his meds now, drinking liquids easily however he still doesn't seem to want to eat or drink very much  Objective: Vital signs in last 24 hours: Temp:  [98 F (36.7 C)-98.7 F (37.1 C)] 98.4 F (36.9 C) (08/06 0443) Pulse Rate:  [46-64] 59 (08/06 0443) Resp:  [13-18] 16 (08/06 0443) BP: (118-211)/(43-68) 163/60 mmHg (08/06 0443) SpO2:  [96 %-100 %] 96 % (08/06 0443) Weight:  [144 lb 8 oz (65.545 kg)] 144 lb 8 oz (65.545 kg) (08/05 2036) Last BM Date: 04/18/15 General: alert and oriented times 3, very hard of hearing. Heart: regular rate and rythm Abdomen: soft, non-tender, non-distended, normal bowel sounds   Lab Results:  Recent Labs  04/18/15 0448 04/19/15 0716 04/20/15 0437  WBC 7.0  --  10.6*  HGB 10.9* 10.9* 10.8*  PLT 103*  --  73*  MCV 87.9  --  89.9    Recent Labs  04/18/15 0448 04/19/15 0716 04/20/15 0437  NA 138 142 138  K 2.7* 4.4 3.1*  CL 107  --  106  CO2 23  --  24  GLUCOSE 91 87 80  BUN 22*  --  20  CREATININE 1.28*  --  1.24  CALCIUM 8.3*  --  8.0*    Medications: Scheduled Meds: . amLODipine  5 mg Oral Daily  . feeding supplement (ENSURE ENLIVE)  237 mL Oral TID  . hydrALAZINE  25 mg Oral 3 times per day  . insulin aspart  0-9 Units Subcutaneous TID WC  . propranolol  10 mg Oral BID   Continuous Infusions: . sodium chloride 50 mL/hr at 04/19/15 2004   PRN Meds:.acetaminophen, hydrALAZINE, HYDROmorphone (DILAUDID) injection, ondansetron    Assessment/Plan: 81 y.o. male with chronic, benign peptic GE junction stricture  He is able to tolerate liquids, full liquids. I explained to him and his son today that he needs to stay on full liquid diet for now, perhaps puree consistency.  Needs to be on PPI twice daily (20-30 min before BF and dinner meals).  [facility] office will contact him about out patient  repeat EGD, dilation in next 2-3 weeks.  His son asked about home health services in the area.  I will defer that to primary team.  Please call or page with any further questions or concerns.    Rachael Fee, MD  04/20/2015, 7:17 AM [facility] Pager [phone]

## 2015-04-21 DIAGNOSIS — R630 Anorexia: Secondary | ICD-10-CM

## 2015-04-21 LAB — BASIC METABOLIC PANEL
ANION GAP: 8 (ref 5–15)
BUN: 22 mg/dL — AB (ref 6–20)
CALCIUM: 8.4 mg/dL — AB (ref 8.9–10.3)
CO2: 26 mmol/L (ref 22–32)
Chloride: 106 mmol/L (ref 101–111)
Creatinine, Ser: 1.16 mg/dL (ref 0.61–1.24)
GFR calc Af Amer: >60 mL/min (ref >60)
GFR calc non Af Amer: 56 mL/min — ABNORMAL LOW (ref >60)
GLUCOSE: 81 mg/dL (ref 65–99)
Potassium: 3 mmol/L — ABNORMAL LOW (ref 3.5–5.1)
Sodium: 140 mmol/L (ref 135–145)

## 2015-04-21 LAB — CBC
HCT: 34.3 % — ABNORMAL LOW (ref 39.0–52.0)
HEMOGLOBIN: 11.8 g/dL — AB (ref 13.0–17.0)
MCH: 30.6 pg (ref 26.0–34.0)
MCHC: 34.4 g/dL (ref 30.0–36.0)
MCV: 89.1 fL (ref 78.0–100.0)
Platelets: 83 10*3/uL — ABNORMAL LOW (ref 150–400)
RBC: 3.85 MIL/uL — ABNORMAL LOW (ref 4.22–5.81)
RDW: 15 % (ref 11.5–15.5)
WBC: 9.7 10*3/uL (ref 4.0–10.5)

## 2015-04-21 LAB — GLUCOSE, CAPILLARY
Glucose-Capillary: 102 mg/dL — ABNORMAL HIGH (ref 65–99)
Glucose-Capillary: 103 mg/dL — ABNORMAL HIGH (ref 65–99)
Glucose-Capillary: 80 mg/dL (ref 65–99)
Glucose-Capillary: 89 mg/dL (ref 65–99)
Glucose-Capillary: 95 mg/dL (ref 65–99)
Glucose-Capillary: 96 mg/dL (ref 65–99)

## 2015-04-21 LAB — MAGNESIUM: Magnesium: 1.9 mg/dL (ref 1.7–2.4)

## 2015-04-21 MED ORDER — MAGNESIUM OXIDE 400 (241.3 MG) MG PO TABS
400.0000 mg | ORAL_TABLET | Freq: Once | ORAL | Status: AC
  Administered 2015-04-21: 400 mg via ORAL
  Filled 2015-04-21: qty 1

## 2015-04-21 MED ORDER — DEXTROSE-NACL 5-0.9 % IV SOLN
INTRAVENOUS | Status: DC
  Administered 2015-04-21 – 2015-04-23 (×4): via INTRAVENOUS

## 2015-04-21 MED ORDER — POTASSIUM CHLORIDE 20 MEQ/15ML (10%) PO SOLN
60.0000 meq | Freq: Once | ORAL | Status: AC
  Administered 2015-04-21: 60 meq via ORAL
  Filled 2015-04-21: qty 45

## 2015-04-21 MED ORDER — MIRTAZAPINE 7.5 MG PO TABS
7.5000 mg | ORAL_TABLET | Freq: Every day | ORAL | Status: DC
  Administered 2015-04-21 – 2015-04-22 (×2): 7.5 mg via ORAL
  Filled 2015-04-21 (×2): qty 1

## 2015-04-21 NOTE — Progress Notes (Signed)
[facility] PROGRESS NOTE  Tanner Owen ZOX:[id number] DOB: 06-12-1929 DOA: 04/13/2015 PCP: Ezequiel Kayser, MD HPI/Subjective: 81 y.o. WM PMHx HTN, DM Type 2, Hyperlipidemia, Stage III CKD, Renal Artery Stenosis, anemia, thrombocytopenia, diverticulitis, Esophageal Stricture S/P Dilatitions in the past  Presents to the Ed with complaints of Nausea and vomiting and ABD Pain x 10 days and being unable to hold down foods and liquids. He has had poor intake of foods and liquids over the past 3 weeks according to his son. In the ED, he was found to be orthostatic, as well as having and increase in his BUN/Cr. He was referred for admission.  8/7 A/O 4, NAD, negative N/V, negative abdominal pain, negative CP, negative SOB. Patient states that he is not eating because he has no appetite. Patient also more somnolent today, but does answer appropriately.     Assessment/Plan: Esophageal stricture - Gastroenterology consulted, EGD with dilatation done twice, on 8/3 as well as 8/5 -Continue dysphagia 1 diet and encourage PO intake   Anorexia -Continue Megace 800 mg daily  Urinary retention - foley catheter   FTT - due to esophageal stricture -Patient somewhat more somnolent today, have stopped all sedating medication   Severe protein calorie malnutrition - Per nutrition continue ensure TID  Essential Hypertension  -Continue propranolol to 10 mg twice  -Increase Norvasc 10mg , -Increase hydralazine to 35 mg TID -Obtain orthostatic vitals.  Bradycardia - Monitor on telemetry. Continued bradycardia however BP satisfactory.  Dehydration with orthostatics - Patient still not taking adequate PO nutrition will switch to D5-0.9% saline at 75 ml /hr   Chronic kidney disease stage III - Creatinine is stable at baseline, continue to monitor  Diabetes mellitus controlled - 7/31 hemoglobin A1c = 6.8. - Continue sliding scale coverage  Hypokalemia - Likely in the setting of  poor by mouth intake - Potassium 60 mEq 1 -Recheck K/magnesium in a.m.  Hypomagnesemia -Magnesium oxide 400 mg 1  Thrombocytopenia - Acute on chronic, dating as far back as 2012 - Platelets trending up. Continue to monitor closely for signs of bleeding.    Code Status: Full Family Communication: Son present Disposition Plan: Discharge in next 24-48 hours   Consultants: Dr.Daniel Marye Round (GI)   Procedures: 8/3 and 8/5;EGD with dilatation     Cultures NA  Antibiotics: NA   DVT prophylaxis SCD    Objective: Filed Vitals:   04/20/15 1047 04/20/15 1604 04/20/15 2034 04/21/15 0420  BP: 159/63 134/53 140/74 167/58  Pulse: 61 58 55 53  Temp: 97.8 F (36.6 C) 98 F (36.7 C) 98.4 F (36.9 C) 98.2 F (36.8 C)  TempSrc: Oral Oral Oral Oral  Resp: 17 18 20 19   Height:      Weight:   68.539 kg (151 lb 1.6 oz)   SpO2: 96% 95% 97% 97%    Intake/Output Summary (Last 24 hours) at 04/21/15 1301 Last data filed at 04/21/15 0658  Gross per 24 hour  Intake   1390 ml  Output   1200 ml  Net    190 ml   Filed Weights   04/18/15 2026 04/19/15 2036 04/20/15 2034  Weight: 67.541 kg (148 lb 14.4 oz) 65.545 kg (144 lb 8 oz) 68.539 kg (151 lb 1.6 oz)     Exam: General:  Somnolent but arousable, A/O 4, NAD,No acute respiratory distress, cachectic Eyes: Negative headache, eye pain, double vision,negative scleral hemorrhage ENT: Negative Runny nose, negative ear pain, negative tinnitus, negative gingival bleeding, Neck:  Negative scars, masses,  torticollis, lymphadenopathy, JVD Lungs: Clear to auscultation bilaterally without wheezes or crackles Cardiovascular: Regular rate and rhythm without murmur gallop or rub normal S1 and S2 Abdomen:negative abdominal pain, negative dysphagia, nondistended, positive soft, bowel sounds, no rebound, no ascites, no appreciable mass Extremities: No significant cyanosis, clubbing, or edema bilateral lower extremities Psychiatric:   Negative depression, negative anxiety, negative fatigue, negative mania Neurologic:  Cranial nerves II through XII intact, tongue/uvula midline, all extremities muscle strength 5/5, sensation intact throughout, negative dysarthria, negative expressive aphasia, negative receptive aphasia.     Data Reviewed: Basic Metabolic Panel:  Recent Labs Lab 04/16/15 0620 04/17/15 0533 04/18/15 0448 04/19/15 0716 04/20/15 0437 04/20/15 1621 04/21/15 0720  NA 140 141 138 142 138  --  140  K 3.2* 3.0* 2.7* 4.4 3.1* 3.2* 3.0*  CL 109 110 107  --  106  --  106  CO2 21* 22 23  --  24  --  26  GLUCOSE 74 86 91 87 80  --  81  BUN 27* 21* 22*  --  20  --  22*  CREATININE 1.41* 1.22 1.28*  --  1.24  --  1.16  CALCIUM 8.5* 8.6* 8.3*  --  8.0*  --  8.4*  MG  --   --  1.6*  --   --  2.0 1.9   Liver Function Tests: No results for input(s): AST, ALT, ALKPHOS, BILITOT, PROT, ALBUMIN in the last 168 hours. No results for input(s): LIPASE, AMYLASE in the last 168 hours. No results for input(s): AMMONIA in the last 168 hours. CBC:  Recent Labs Lab 04/16/15 0620 04/17/15 0533 04/18/15 0448 04/19/15 0716 04/20/15 0437 04/21/15 0720  WBC 5.8 9.1 7.0  --  10.6* 9.7  HGB 10.9* 11.9* 10.9* 10.9* 10.8* 11.8*  HCT 31.9* 34.1* 31.2* 32.0* 31.1* 34.3*  MCV 90.4 88.8 87.9  --  89.9 89.1  PLT 102* 114* 103*  --  73* 83*   Cardiac Enzymes: No results for input(s): CKTOTAL, CKMB, CKMBINDEX, TROPONINI in the last 168 hours. BNP (last 3 results) No results for input(s): BNP in the last 8760 hours.  ProBNP (last 3 results) No results for input(s): PROBNP in the last 8760 hours.  CBG:  Recent Labs Lab 04/20/15 2029 04/20/15 2356 04/21/15 0417 04/21/15 0740 04/21/15 1159  GLUCAP 94 85 80 89 96    No results found for this or any previous visit (from the past 240 hour(s)).   Studies: No results found.  Scheduled Meds: . amLODipine  10 mg Oral Daily  . feeding supplement (ENSURE ENLIVE)  237 mL  Oral TID  . hydrALAZINE  35 mg Oral 3 times per day  . insulin aspart  0-9 Units Subcutaneous TID WC  . megestrol  800 mg Oral Daily  . propranolol  10 mg Oral BID   Continuous Infusions: . sodium chloride 50 mL/hr at 04/21/15 1040    Principal Problem:   Dehydration Active Problems:   ESOPHAGEAL STRICTURE   Orthostasis   Nausea and vomiting   Hypokalemia   Chronic kidney disease (CKD), stage III (moderate)   Diabetes mellitus   Essential hypertension   Thrombocytopenia   Protein-calorie malnutrition, severe   Goals of care, counseling/discussion   Dysphagia, pharyngoesophageal phase   Palliative care encounter   Palliative care by specialist   Esophageal stricture   Anorexia   Urinary retention   Failure to thrive in adult   Severe protein-calorie malnutrition   Bradycardia   CKD (chronic kidney  disease), stage III   Diabetes type 2, controlled    Time spent: 40 minutes    WOODS, CURTIS J  [facility] Pager [phone]. If 7PM-7AM, please contact night-coverage at [email or web address], password [facility] 04/21/2015, 1:01 PM  LOS: 7 days    Care during the described time interval was provided by me .  I have reviewed this patient's available data, including medical history, events of note, physical examination, and all test results as part of my evaluation. I have personally reviewed and interpreted all radiology studies.   Carolyne Littles, MD [phone] Pager

## 2015-04-21 NOTE — Progress Notes (Signed)
Daily Progress Note   Patient Name: Tanner Owen       Date: 04/21/2015 DOB: August 10, 1929  Age: 81 y.o. MRN#: [id number] Attending Physician: Allie Bossier, MD Primary Care Physician: Jerlyn Ly, MD Admit Date: 04/13/2015  Reason for Consultation/Follow-up: Establishing goals of care  Subjective: Tanner Owen is an 81 year old male with PMHx hypertension, diabetes, chronic kidney disease and history of esophageal strictures (followed by Dr. Henrene Pastor, gastroenterology), admitted due to complications of acute on chronic poor PO intake, weakness, nausea, and worsened dysphagia. S/p EGD with some improvement in swallowing.  Interval Events: Met with patient and his son.  Report that he had a good night overnight and slept well.  Has been taking in liquids, but still working to increase overall volume.  Still stating that his appetite has not returned.  Length of Stay: 7 days  Current Medications: Scheduled Meds:  . amLODipine  10 mg Oral Daily  . feeding supplement (ENSURE ENLIVE)  237 mL Oral TID  . hydrALAZINE  35 mg Oral 3 times per day  . insulin aspart  0-9 Units Subcutaneous TID WC  . megestrol  800 mg Oral Daily  . propranolol  10 mg Oral BID    Continuous Infusions: . sodium chloride 50 mL/hr at 04/20/15 1755    PRN Meds: acetaminophen, hydrALAZINE, HYDROmorphone (DILAUDID) injection, ondansetron  Palliative Performance Scale: 40%     Vital Signs: BP 167/58 mmHg  Pulse 53  Temp(Src) 98.2 F (36.8 C) (Oral)  Resp 19  Ht 5' 11"  (1.803 m)  Wt 68.539 kg (151 lb 1.6 oz)  BMI 21.08 kg/m2  SpO2 97% SpO2: SpO2: 97 % O2 Device: O2 Device: Not Delivered O2 Flow Rate: O2 Flow Rate (L/min): 3 L/min  Intake/output summary:  Intake/Output Summary (Last 24 hours) at 04/21/15 1027 Last data filed at 04/21/15 6160  Gross per 24 hour  Intake   1510 ml  Output   1200 ml  Net    310 ml   LBM:   Baseline Weight: Weight: 60.464 kg (133 lb 4.8 oz) Most recent weight:  Weight: 68.539 kg (151 lb 1.6 oz)  Physical Exam: Gen. elderly male thin and chronically ill-appearing, lying in bed, no acute distress ENT: Mucous membranes tacky, no scleral icterus, or discharge noted Respiratory:  Lungs clear to auscultation bilaterally with anterior auscultation only Cardiovascular regular with no murmur appreciated Abdomen: Soft nontender nondistended Musculoskeletal: Some muscle wasting noted. No significant edema.             Additional Data Reviewed: Recent Labs     04/20/15  0437  04/21/15  0720  WBC  10.6*  9.7  HGB  10.8*  11.8*  PLT  73*  83*  NA  138  140  BUN  20  22*  CREATININE  1.24  1.16     Problem List:  Patient Active Problem List   Diagnosis Date Noted  . Dysphagia, pharyngoesophageal phase   . Palliative care encounter   . Palliative care by specialist   . Esophageal stricture   . Anorexia   . Urinary retention   . Failure to thrive in adult   . Severe protein-calorie malnutrition   . Bradycardia   . CKD (chronic kidney disease), stage III   . Diabetes type 2, controlled   . Protein-calorie malnutrition, severe 04/18/2015  . Goals of care, counseling/discussion   . Dehydration 04/14/2015  . Nausea and vomiting 04/14/2015  . Hypokalemia 04/14/2015  . Chronic kidney disease (  CKD), stage III (moderate) 04/14/2015  . Diabetes mellitus 04/14/2015  . Essential hypertension 04/14/2015  . Thrombocytopenia 04/14/2015  . Syncope 01/07/2012  . Orthostasis 01/07/2012  . ARF (acute renal failure) 01/07/2012  . UTI (lower urinary tract infection) 01/07/2012  . Hyponatremia 01/07/2012  . ESOPHAGEAL STRICTURE 05/07/2009     Palliative Care Assessment & Plan    Code Status:  Full code  Goals of Care:  Discussed again with patient's son.  They are appropriately invested in plan to return to home and see how he does following EGD.  Hope is that he is able to increase his PO intake and maintain his nutritional status, but they are  realistic that this may not be a possibility.  Last EGD improved his dysphagia for 3 years.  If this does not improve, patient reports that he is not interested in feeding tube.  His goal at that point would be to stay at his home and enjoy time with his family rather than return to the hospital.  We discussed possibility of hospice support at home if this is the case, and he is in agreement that he would like to have f/u with his PCP to discuss this as well as his code status following discharge.  I will call Dr. Haynes Kerns tomorrow to update him on these conversations.    Symptom Management:  Dysphagia: Appears to be improved status post dilation. Continued to monitor closely as patient is at high risk for nutritional decline. For follow-up as outpatient with PCP.  Decreased appetite: Started on megace.  His son thinks that his appetite will improve after he is back in his home.  If not, would consider addition of mirtazapine 7.52m-15mg QHS in order to improve appetite and consider that depression may be factor in decreased appetite.  Will discuss this with Dr. PHaynes Kernsas well.   Psycho-social/Spiritual:  Desire for further Chaplaincy support:no   Prognosis: Unable to determine as will depend on nutritional status Discharge Planning: Home.  Patients son will be off work next 2 weeks to assist in his care.   Care plan was discussed with patient and his son, DHerby Abrahamyou for allowing the Palliative Medicine Team to assist in the care of this patient.   Time In: 10:15 Time Out: 10:30 Total Time 25 Prolonged Time Billed  no     Greater than 50%  of this time was spent counseling and coordinating care related to the above assessment and plan.   GMicheline Rough MD  04/21/2015, 10:27 AM  Please contact Palliative Medicine Team phone at [phone] questions and concerns.

## 2015-04-22 ENCOUNTER — Encounter (HOSPITAL_COMMUNITY): Payer: Self-pay | Admitting: Gastroenterology

## 2015-04-22 DIAGNOSIS — R627 Adult failure to thrive: Secondary | ICD-10-CM

## 2015-04-22 LAB — GLUCOSE, CAPILLARY
GLUCOSE-CAPILLARY: 137 mg/dL — AB (ref 65–99)
GLUCOSE-CAPILLARY: 137 mg/dL — AB (ref 65–99)
Glucose-Capillary: 90 mg/dL (ref 65–99)
Glucose-Capillary: 100 mg/dL — ABNORMAL HIGH (ref 65–99)
Glucose-Capillary: 115 mg/dL — ABNORMAL HIGH (ref 65–99)

## 2015-04-22 MED ORDER — METOCLOPRAMIDE HCL 5 MG PO TABS
5.0000 mg | ORAL_TABLET | Freq: Three times a day (TID) | ORAL | Status: DC
  Administered 2015-04-22 – 2015-04-23 (×2): 5 mg via ORAL
  Filled 2015-04-22 (×2): qty 1

## 2015-04-22 MED ORDER — PRO-STAT SUGAR FREE PO LIQD
30.0000 mL | Freq: Two times a day (BID) | ORAL | Status: DC
  Administered 2015-04-22 – 2015-04-23 (×3): 30 mL via ORAL
  Filled 2015-04-22 (×4): qty 30

## 2015-04-22 NOTE — Care Management Note (Signed)
Case Management Note  Patient Details  Name: BARTT GONZAGA MRN: [id number] Date of Birth: 1928/12/09  Subjective/Objective:       CM following for progression and d/c planning.             Action/Plan: Met with pt and family re d/c plan, family has selected [facility], that agency notified and will met with family  Expected Discharge Date:  04/23/2015            Expected Discharge Plan:  Home w Hospice Care  In-House Referral:  NA  Discharge planning Services  CM Consult, NA  Post Acute Care Choice:    Choice offered to:  Adult Children  DME Arranged:    DME Agency:     HH Arranged:  RN, Nurse's Aide [place] Agency:  [facility]  Status of Service:  Completed, signed off  Medicare Important Message Given:  Yes-second notification given Date Medicare IM Given:    Medicare IM give by:    Date Additional Medicare IM Given:    Additional Medicare Important Message give by:     If discussed at [place] of Stay Meetings, dates discussed:    Additional Comments:  Adron Bene, RN 04/22/2015, 2:49 PM

## 2015-04-22 NOTE — Progress Notes (Signed)
Daily Progress Note   Patient Name: Tanner Owen       Date: 04/22/2015 DOB: 06/13/1929  Age: 81 y.o. MRN#: [id number] Attending Physician: Allie Bossier, MD Primary Care Physician: Jerlyn Ly, MD Admit Date: 04/13/2015  Reason for Consultation/Follow-up: Disposition and Establishing goals of care  Subjective: Tanner Owen is an 81 year old male with PMHx hypertension, diabetes, chronic kidney disease and history of esophageal strictures (followed by Dr. Henrene Pastor, gastroenterology), admitted due to complications of acute on chronic poor PO intake, weakness, nausea, and worsened dysphagia. S/p EGD 2 this admission. Interval Events: I met with Tanner Owen and his oldest son, Shanon Brow, today. His ex-wife was also present in conversation. They report being concerned that Tanner Owen condition continues to decline. He is not nearly as awake today, and has had very little by mouth intake over the past 2 days. He will not drink anything at all when offered her my encounter in the room. He will arouse briefly, but does not fully engage in conversations. When asked, he reports that his wishes are to be at home.  I spoke at length with the family regarding my concern that with his decrease in nutrition, change in cognition, and decreasing functional status over the past few months that it is likely that this is a terminal decline.  I also spoke with the patient's primary care provider, Dr. Haynes Kerns, this morning and Dr. Haynes Kerns is in agreement with patient's clinical status has deteriorated recently.  I discussed with the family Tanner Owen clinical course, as well as his wishes to be at home. He reports they want to take him home, however they are afraid they will not be able to manage his care without hospice support. There will be family available to manage daily caregiving needs.   While we have been hopeful that he would respond to treatment of his mechanical strictures, unfortunately it appears that his  nutritional status is continuing to decline, and this in conjunction with his frail state and chronic medical problems would be to an expected prognosis of less than 6 months if this were to continue along its natural course.  I discussed with the primary hospitalist, who is in agreement that disposition home with hospice support is in the best interest of preserving the patient's wishes. Asked case management to present family options for home hospice.  Length of Stay: 8 days  Current Medications: Scheduled Meds:  . amLODipine  10 mg Oral Daily  . feeding supplement (ENSURE ENLIVE)  237 mL Oral TID  . hydrALAZINE  35 mg Oral 3 times per day  . insulin aspart  0-9 Units Subcutaneous TID WC  . megestrol  800 mg Oral Daily  . mirtazapine  7.5 mg Oral QHS  . propranolol  10 mg Oral BID    Continuous Infusions: . dextrose 5 % and 0.9% NaCl 75 mL/hr at 04/22/15 0609    PRN Meds: acetaminophen, hydrALAZINE, ondansetron  Palliative Performance Scale: 20%     Vital Signs: BP 153/60 mmHg  Pulse 55  Temp(Src) 98.9 F (37.2 C) (Oral)  Resp 17  Ht 5' 11"  (1.803 m)  Wt 67.677 kg (149 lb 3.2 oz)  BMI 20.82 kg/m2  SpO2 97% SpO2: SpO2: 97 % O2 Device: O2 Device: Not Delivered O2 Flow Rate: O2 Flow Rate (L/min): 3 L/min  Intake/output summary:  Intake/Output Summary (Last 24 hours) at 04/22/15 1338 Last data filed at 04/22/15 1321  Gross per 24 hour  Intake 1491.25 ml  Output  1350 ml  Net 141.25 ml   LBM:   Baseline Weight: Weight: 60.464 kg (133 lb 4.8 oz) Most recent weight: Weight: 67.677 kg (149 lb 3.2 oz)  Physical Exam: Gen. elderly male, thin and chronically ill-appearing, lying in bed, no acute distress but largely somnolent. Does arouse briefly but does not stay awake and remain engaged in conversation. Temporal wasting noted. ENT: Mucous membranes tacky, no scleral icterus, or discharge noted Respiratory: Lungs clear to auscultation, shallow  respiration Cardiovascular regular with no murmur appreciated Abdomen: Soft nontender nondistended Musculoskeletal: Muscle wasting noted. No significant edema.             Additional Data Reviewed: Recent Labs     04/20/15  0437  04/21/15  0720  WBC  10.6*  9.7  HGB  10.8*  11.8*  PLT  73*  83*  NA  138  140  BUN  20  22*  CREATININE  1.24  1.16     Problem List:  Patient Active Problem List   Diagnosis Date Noted  . Dysphagia, pharyngoesophageal phase   . Palliative care encounter   . Palliative care by specialist   . Esophageal stricture   . Anorexia   . Urinary retention   . Failure to thrive in adult   . Severe protein-calorie malnutrition   . Bradycardia   . CKD (chronic kidney disease), stage III   . Diabetes type 2, controlled   . Protein-calorie malnutrition, severe 04/18/2015  . Goals of care, counseling/discussion   . Dehydration 04/14/2015  . Nausea and vomiting 04/14/2015  . Hypokalemia 04/14/2015  . Chronic kidney disease (CKD), stage III (moderate) 04/14/2015  . Diabetes mellitus 04/14/2015  . Essential hypertension 04/14/2015  . Thrombocytopenia 04/14/2015  . Syncope 01/07/2012  . Orthostasis 01/07/2012  . ARF (acute renal failure) 01/07/2012  . UTI (lower urinary tract infection) 01/07/2012  . Hyponatremia 01/07/2012  . ESOPHAGEAL STRICTURE 05/07/2009     Palliative Care Assessment & Plan    Code Status:  Full code family is discussing this this afternoon and his son reports it will likely be in agreement that his father's wishes would be to change CODE STATUS to DO NOT RESUSCITATE  Goals of Care:  Spoke with Tanner Owen and his family at length today. Patient was clear that his wishes are to go home. Presented with him that the best way to allow him to go home and stay at home would be to enroll in hospice support. This will be supported by his recent decline without response (and even greater decline) noted here in the hospital following  EGD  Symptom Management:  Patient reports that he has no appetite and is feeling sick. Recommend continue Remeron. Would also recommend initiation of Reglan 5 mg 3 times per day.  Palliative Prophylaxis:  Has been having regular bowel movements.  Psycho-social/Spiritual:  Desire for further Chaplaincy support:no   Prognosis: < 6 months patient with changes in cognition, nutrition acutely here in the hospital. He has also had functional status decline over the last several weeks to months. With his mechanical stricture and overall frail state, his natural course would be expected to be less than 6 months if her to follow SPECT in trajectory. Discharge Planning: Home with [place] was discussed with patient, Dr. Sherral Hammers, his son Shanon Brow, and ex-wife.  Thank you for allowing the Palliative Medicine Team to assist in the care of this patient.   Time In: 1000 Time Out: 1040  Total Time 50 Prolonged Time Billed no    Greater than 50%  of this time was spent counseling and coordinating care related to the above assessment and plan.   Micheline Rough, MD  04/22/2015, 1:38 PM  Please contact Palliative Medicine Team phone at [phone] for questions and concerns.

## 2015-04-22 NOTE — Care Management Important Message (Signed)
Important Message  Patient Details  Name: Tanner Owen MRN: [id number] Date of Birth: 08/26/1929   Medicare Important Message Given:  Yes-third notification given    Orson Aloe 04/22/2015, 3:57 PM

## 2015-04-22 NOTE — Progress Notes (Signed)
[facility] PROGRESS NOTE  Tanner Owen ZOX:[id number] DOB: [birth year] DOA: 04/13/2015 PCP: Ezequiel Kayser, MD HPI/Subjective: 81 y.o. WM PMHx HTN, DM Type 2, Hyperlipidemia, Stage III CKD, Renal Artery Stenosis, anemia, thrombocytopenia, diverticulitis, Esophageal Stricture S/P Dilatitions in the past  Presents to the Ed with complaints of Nausea and vomiting and ABD Pain x 10 days and being unable to hold down foods and liquids. He has had poor intake of foods and liquids over the past 3 weeks according to his son. In the ED, he was found to be orthostatic, as well as having and increase in his BUN/Cr. He was referred for admission.  8/8A/O 4, NAD, negative N/V, negative abdominal pain, negative CP, negative SOB. Patient and son state request home hospice   Assessment/Plan: Esophageal stricture - Gastroenterology consulted, EGD with dilatation done twice, on 8/3 as well as 8/5 -Continue dysphagia 1 diet and encourage PO intake   Anorexia -Continue Megace 800 mg daily  Urinary retention - Per patient's and son's request will DC catheter overnight to determine if patient able to void on his own.    FTT - due to esophageal stricture  Severe protein calorie malnutrition - Per nutrition continue ensure TID  Essential Hypertension  -Continue propranolol to 10 mg twice  -Increase Norvasc 10mg , -Increase hydralazine to 35 mg TID  Bradycardia - Monitor on telemetry. Continued bradycardia however BP satisfactory.  Dehydration with orthostatics - Patient still not taking adequate PO nutrition will switch to D5-0.9% saline at 75 ml /hr   Chronic kidney disease stage III - Creatinine is stable at baseline, continue to monitor  Diabetes mellitus controlled - 7/31 hemoglobin A1c = 6.8. - Continue sliding scale coverage  Hypokalemia - Home hospice   Hypomagnesemia Home hospice  Thrombocytopenia - Acute on chronic, dating as far back as 2012 - Home  hospice.    Code Status: Full Family Communication: Son present Disposition Plan: Discharge in next 24-48 hours Home hospice    Consultants: Dr.Daniel Marye Round (GI)   Procedures: 8/3 and 8/5;EGD with dilatation     Cultures NA  Antibiotics: NA   DVT prophylaxis SCD    Objective: Filed Vitals:   04/21/15 2025 04/22/15 0409 04/22/15 0900 04/22/15 1705  BP: 156/94 147/58 153/60 148/57  Pulse: 55 54 55 55  Temp: 97.9 F (36.6 C) 98.7 F (37.1 C) 98.9 F (37.2 C) 97.9 F (36.6 C)  TempSrc: Oral Oral Oral Oral  Resp: 17 16 17 17   Height:      Weight: 67.677 kg (149 lb 3.2 oz)     SpO2: 97% 96% 97% 97%    Intake/Output Summary (Last 24 hours) at 04/22/15 1844 Last data filed at 04/22/15 1750  Gross per 24 hour  Intake 1571.25 ml  Output   1050 ml  Net 521.25 ml   Filed Weights   04/19/15 2036 04/20/15 2034 04/21/15 2025  Weight: 65.545 kg (144 lb 8 oz) 68.539 kg (151 lb 1.6 oz) 67.677 kg (149 lb 3.2 oz)     Exam: General:  A/O 4, NAD,No acute respiratory distress, cachectic Eyes: Negative headache, eye pain, double vision,negative scleral hemorrhage ENT: Negative Runny nose, negative ear pain, negative tinnitus, negative gingival bleeding, Neck:  Negative scars, masses, torticollis, lymphadenopathy, JVD Lungs: Clear to auscultation bilaterally without wheezes or crackles Cardiovascular: Regular rate and rhythm without murmur gallop or rub normal S1 and S2 Abdomen:negative abdominal pain, negative dysphagia, nondistended, positive soft, bowel sounds, no rebound, no ascites, no appreciable mass Extremities: No  significant cyanosis, clubbing, or edema bilateral lower extremities Psychiatric:  Negative depression, negative anxiety, negative fatigue, negative mania Neurologic:  Cranial nerves II through XII intact, tongue/uvula midline, all extremities muscle strength 5/5, sensation intact throughout, negative dysarthria, negative expressive aphasia,  negative receptive aphasia.     Data Reviewed: Basic Metabolic Panel:  Recent Labs Lab 04/16/15 0620 04/17/15 0533 04/18/15 0448 04/19/15 0716 04/20/15 0437 04/20/15 1621 04/21/15 0720  NA 140 141 138 142 138  --  140  K 3.2* 3.0* 2.7* 4.4 3.1* 3.2* 3.0*  CL 109 110 107  --  106  --  106  CO2 21* 22 23  --  24  --  26  GLUCOSE 74 86 91 87 80  --  81  BUN 27* 21* 22*  --  20  --  22*  CREATININE 1.41* 1.22 1.28*  --  1.24  --  1.16  CALCIUM 8.5* 8.6* 8.3*  --  8.0*  --  8.4*  MG  --   --  1.6*  --   --  2.0 1.9   Liver Function Tests: No results for input(s): AST, ALT, ALKPHOS, BILITOT, PROT, ALBUMIN in the last 168 hours. No results for input(s): LIPASE, AMYLASE in the last 168 hours. No results for input(s): AMMONIA in the last 168 hours. CBC:  Recent Labs Lab 04/16/15 0620 04/17/15 0533 04/18/15 0448 04/19/15 0716 04/20/15 0437 04/21/15 0720  WBC 5.8 9.1 7.0  --  10.6* 9.7  HGB 10.9* 11.9* 10.9* 10.9* 10.8* 11.8*  HCT 31.9* 34.1* 31.2* 32.0* 31.1* 34.3*  MCV 90.4 88.8 87.9  --  89.9 89.1  PLT 102* 114* 103*  --  73* 83*   Cardiac Enzymes: No results for input(s): CKTOTAL, CKMB, CKMBINDEX, TROPONINI in the last 168 hours. BNP (last 3 results) No results for input(s): BNP in the last 8760 hours.  ProBNP (last 3 results) No results for input(s): PROBNP in the last 8760 hours.  CBG:  Recent Labs Lab 04/21/15 2352 04/22/15 0407 04/22/15 0735 04/22/15 1145 04/22/15 1701  GLUCAP 103* 90 100* 115* 137*    No results found for this or any previous visit (from the past 240 hour(s)).   Studies: No results found.  Scheduled Meds: . amLODipine  10 mg Oral Daily  . feeding supplement (ENSURE ENLIVE)  237 mL Oral TID  . feeding supplement (PRO-STAT SUGAR FREE 64)  30 mL Oral BID  . hydrALAZINE  35 mg Oral 3 times per day  . insulin aspart  0-9 Units Subcutaneous TID WC  . megestrol  800 mg Oral Daily  . metoCLOPramide  5 mg Oral TID AC  . mirtazapine   7.5 mg Oral QHS  . propranolol  10 mg Oral BID   Continuous Infusions: . dextrose 5 % and 0.9% NaCl 75 mL/hr at 04/22/15 0609    Principal Problem:   Dehydration Active Problems:   ESOPHAGEAL STRICTURE   Orthostasis   Nausea and vomiting   Hypokalemia   Chronic kidney disease (CKD), stage III (moderate)   Diabetes mellitus   Essential hypertension   Thrombocytopenia   Protein-calorie malnutrition, severe   Goals of care, counseling/discussion   Dysphagia, pharyngoesophageal phase   Palliative care encounter   Palliative care by specialist   Esophageal stricture   Anorexia   Urinary retention   Failure to thrive in adult   Severe protein-calorie malnutrition   Bradycardia   CKD (chronic kidney disease), stage III   Diabetes type 2, controlled  Time spent: 40 minutes    Herman Fiero J  [facility] Pager [phone]. If 7PM-7AM, please contact night-coverage at [email or web address], password [facility] 04/22/2015, 6:44 PM  LOS: 8 days    Care during the described time interval was provided by me .  I have reviewed this patient's available data, including medical history, events of note, physical examination, and all test results as part of my evaluation. I have personally reviewed and interpreted all radiology studies.   Carolyne Littles, MD [phone] Pager

## 2015-04-22 NOTE — Progress Notes (Signed)
Nutrition Follow-up  DOCUMENTATION CODES:   Severe malnutrition in context of acute illness/injury  INTERVENTION:   Continue Ensure Pudding po TID, each supplement provides 170 kcal and 4 grams of protein.  Provide 30 ml Prostat po BID, each dose provides 100 kcal and 15 grams of protein.   RD to continue to monitor.   NUTRITION DIAGNOSIS:   Malnutrition related to acute illness as evidenced by energy intake < or equal to 50% for > or equal to 5 days, moderate depletions of muscle mass, moderate depletion of body fat; ongoing  GOAL:   Patient will meet greater than or equal to 90% of their needs; not met  MONITOR:   PO intake, Supplement acceptance, Weight trends, Labs, I & O's  REASON FOR ASSESSMENT:   Consult Assessment of nutrition requirement/status  ASSESSMENT:   Patient is a 81 year old male with hypertension, diabetes, chronic kidney disease and history of esophageal strictures seen by gastroenterology, presented with acute on chronic poor by mouth intake, dysphagia as well as nausea. He was seen recently by Dr. Henrene Pastor his gastroenterologist about a month ago, offered an EGD however patient wanted to see how he is doing on his own. He was admitted with dehydration and weakness in the setting of being unable to eat or drink appropriately for a week.   Pt continues to have a lack of appetite. Meal completion has been 0-25%. Pt has been consuming his Ensure intermittently. RD to additionally order Prostat to aid in protein needs. Goals of care and code status to be further discussed. Per Palliative note, pt does not want tube feeding. RD to continue to monitor.   Labs and medications reviewed.   Diet Order:  DIET - DYS 1 Room service appropriate?: Yes; Fluid consistency:: Thin  Skin:  Reviewed, no issues  Last BM:  8/7  Height:   Ht Readings from Last 1 Encounters:  04/14/15 5' 11"  (1.803 m)    Weight:   Wt Readings from Last 1 Encounters:  04/21/15 149 lb 3.2  oz (67.677 kg)    Ideal Body Weight:  78 kg  BMI:  Body mass index is 20.82 kg/(m^2).  Estimated Nutritional Needs:   Kcal:  1850-2050  Protein:  80-95 grams  Fluid:  1.8 - 2. L/day  EDUCATION NEEDS:   No education needs identified at this time  Corrin Parker, MS, RD, LDN Pager # [phone] After hours/ weekend pager # [phone]

## 2015-04-23 DIAGNOSIS — N183 Chronic kidney disease, stage 3 (moderate): Secondary | ICD-10-CM

## 2015-04-23 DIAGNOSIS — I10 Essential (primary) hypertension: Secondary | ICD-10-CM

## 2015-04-23 DIAGNOSIS — E119 Type 2 diabetes mellitus without complications: Secondary | ICD-10-CM

## 2015-04-23 LAB — GLUCOSE, CAPILLARY
GLUCOSE-CAPILLARY: 111 mg/dL — AB (ref 65–99)
Glucose-Capillary: 116 mg/dL — ABNORMAL HIGH (ref 65–99)
Glucose-Capillary: 127 mg/dL — ABNORMAL HIGH (ref 65–99)

## 2015-04-23 MED ORDER — MEGESTROL ACETATE 400 MG/10ML PO SUSP: 800.0000 mg | Freq: Every day | ORAL | Status: AC

## 2015-04-23 MED ORDER — METOCLOPRAMIDE HCL 5 MG PO TABS: 5.0000 mg | ORAL_TABLET | Freq: Three times a day (TID) | ORAL | Status: AC

## 2015-04-23 MED ORDER — PROPRANOLOL HCL 10 MG PO TABS: 10.0000 mg | ORAL_TABLET | Freq: Two times a day (BID) | ORAL | Status: AC

## 2015-04-23 MED ORDER — PRO-STAT SUGAR FREE PO LIQD: 30.0000 mL | Freq: Two times a day (BID) | ORAL | Status: AC

## 2015-04-23 MED ORDER — ENSURE ENLIVE PO LIQD: 237.0000 mL | Freq: Three times a day (TID) | ORAL | Status: AC

## 2015-04-23 MED ORDER — AMLODIPINE BESYLATE 10 MG PO TABS: 10.0000 mg | ORAL_TABLET | Freq: Every day | ORAL | Status: AC

## 2015-04-23 NOTE — Progress Notes (Signed)
Daily Progress Note   Patient Name: Tanner Owen       Date: 04/23/2015 DOB: October 31, 1928  Age: 81 y.o. MRN#: [id number] Attending Physician: Geradine Girt, DO Primary Care Physician: Jerlyn Ly, MD Admit Date: 04/13/2015  Reason for Consultation/Follow-up: Disposition and Establishing goals of care  Subjective: Tanner Owen is an 81 year old male with PMHx hypertension, diabetes, chronic kidney disease and history of esophageal strictures (followed by Dr. Henrene Pastor, gastroenterology), admitted due to complications of acute on chronic poor PO intake, weakness, nausea, and worsened dysphagia. S/p EGD 2 this admission. Interval Events: I met with Mr. Hanning and his ex-wife this morning. She reports that he had a good night overall. He briefly open his eyes to my examination, but immediately fell back to sleep and not engage in conversation.  His ex-wife reports that family is preparing for transition home with home hospice today. She denied any needs or concerns other than to determination whether he is voiding well enough to be discharged without catheter.  I called and spoke with Dr. Tempie Donning office this morning. I discussed plan for Tanner Owen discharge with office staff and left a message for his nurse to ensure that they were updated on plan for his discharge home with hospice support. I left the contact number for palliative medicine on the voicemail in case of any questions.  Length of Stay: 9 days  Current Medications: Scheduled Meds:  . amLODipine  10 mg Oral Daily  . feeding supplement (ENSURE ENLIVE)  237 mL Oral TID  . feeding supplement (PRO-STAT SUGAR FREE 64)  30 mL Oral BID  . hydrALAZINE  35 mg Oral 3 times per day  . insulin aspart  0-9 Units Subcutaneous TID WC  . megestrol  800 mg Oral Daily  . metoCLOPramide  5 mg Oral TID AC  . mirtazapine  7.5 mg Oral QHS  . propranolol  10 mg Oral BID    Continuous Infusions: . dextrose 5 % and 0.9% NaCl 75 mL/hr at 04/23/15  0723    PRN Meds: acetaminophen, hydrALAZINE, ondansetron  Palliative Performance Scale: 20%     Vital Signs: BP 114/55 mmHg  Pulse 54  Temp(Src) 97.9 F (36.6 C) (Axillary)  Resp 18  Ht _0  (1.803 m)  Wt 68.927 kg (151 lb 15.3 oz)  BMI 21.20 kg/m2  SpO2 97% SpO2: SpO2: 97 % O2 Device: O2 Device: Not Delivered O2 Flow Rate: O2 Flow Rate (L/min): 3 L/min  Intake/output summary:   Intake/Output Summary (Last 24 hours) at 04/23/15 4580 Last data filed at 04/23/15 0200  Gross per 24 hour  Intake   1720 ml  Output    625 ml  Net   1095 ml   LBM:   Baseline Weight: Weight: 60.464 kg (133 lb 4.8 oz) Most recent weight: Weight: 68.927 kg (151 lb 15.3 oz)  Physical Exam: Gen. elderly male, thin and chronically ill-appearing, lying in bed, no acute distress. largely somnolent. Does arouse briefly but does not stay awake and remain engaged in conversation. Temporal wasting noted. ENT: Mucous membranes tacky, no scleral icterus, or discharge noted Respiratory: Lungs clear to auscultation, shallow respiration Cardiovascular regular with no murmur appreciated Abdomen: Soft nontender nondistended Musculoskeletal: Muscle wasting noted. No significant edema.             Additional Data Reviewed: Recent Labs     04/21/15  0720  WBC  9.7  HGB  11.8*  PLT  83*  NA  140  BUN  22*  CREATININE  1.16     Problem List:  Patient Active Problem List   Diagnosis Date Noted  . Dysphagia, pharyngoesophageal phase   . Palliative care encounter   . Palliative care by specialist   . Esophageal stricture   . Anorexia   . Urinary retention   . Failure to thrive in adult   . Severe protein-calorie malnutrition   . Bradycardia   . CKD (chronic kidney disease), stage III   . Diabetes type 2, controlled   . Protein-calorie malnutrition, severe 04/18/2015  . Goals of care, counseling/discussion   . Dehydration 04/14/2015  . Nausea and vomiting 04/14/2015  . Hypokalemia  04/14/2015  . Chronic kidney disease (CKD), stage III (moderate) 04/14/2015  . Diabetes mellitus 04/14/2015  . Essential hypertension 04/14/2015  . Thrombocytopenia 04/14/2015  . Syncope 01/07/2012  . Orthostasis 01/07/2012  . ARF (acute renal failure) 01/07/2012  . UTI (lower urinary tract infection) 01/07/2012  . Hyponatremia 01/07/2012  . ESOPHAGEAL STRICTURE 05/07/2009     Palliative Care Assessment & Plan    Code Status:  Full code. This was discussed with family and patient. Based on his stated goals, we recommend change to DO NOT RESUSCITATE. His family is in agreement, but they would prefer to wait until he is transitioned home to continue this conversation with him. Hospice is aware, and agree that this is a conversation they will continue on his discharge.  Goals of Care: Patient has been very clear since admission that his goal is to go home and remain there. He is being discharged with home hospice support.  Symptom Management:  Patient reports that he has no appetite and is feeling sick. Recommend continue Remeron as well as Reglan 5 mg 3 times per day.  Palliative Prophylaxis:  Has been having regular bowel movements.  Psycho-social/Spiritual:  Desire for further Chaplaincy support:no   Prognosis: < 6 months patient with changes in cognition, nutrition acutely here in the hospital. He has also had functional status decline over the last several weeks to months. With his mechanical stricture and overall frail state, his natural course would be expected to be less than 6 months if he follows the expected trajectory. Discharge Planning: Home with Hospice   Care plan was discussed with patient and his ex-wife   Thank you for allowing the Palliative Medicine Team to assist in the care of this patient.   Time In: 0745 Time Out: 0800 Total Time 20 Prolonged Time Billed no    Greater than 50%  of this time was spent counseling and coordinating care related to  the above assessment and plan.   Micheline Rough, MD  04/23/2015, 9:09 AM  Please contact Palliative Medicine Team phone at [phone] for questions and concerns.

## 2015-04-23 NOTE — Progress Notes (Signed)
Bladder scan showed 289 ml. Pt voices no concerns of abdominal pressure. Will recheck bladder scan again at 0630 and continue to monitor for spontaneous void. Littie Deeds RN

## 2015-04-23 NOTE — Discharge Summary (Signed)
Physician Discharge Summary  Tanner Owen:[id number] DOB: May 05, 1929 DOA: 04/13/2015  PCP: Ezequiel Kayser, MD  Admit date: 04/13/2015 Discharge date: 04/23/2015  Time spent: 35 minutes  Recommendations for Outpatient Follow-up:  1. Home with hospice  Discharge Diagnoses:  Principal Problem:   Dehydration Active Problems:   ESOPHAGEAL STRICTURE   Orthostasis   Nausea and vomiting   Hypokalemia   Chronic kidney disease (CKD), stage III (moderate)   Diabetes mellitus   Essential hypertension   Thrombocytopenia   Protein-calorie malnutrition, severe   Goals of care, counseling/discussion   Dysphagia, pharyngoesophageal phase   Palliative care encounter   Palliative care by specialist   Esophageal stricture   Anorexia   Urinary retention   Failure to thrive in adult   Severe protein-calorie malnutrition   Bradycardia   CKD (chronic kidney disease), stage III   Diabetes type 2, controlled   Discharge Condition: terminal  Diet recommendation: DYS 1  Filed Weights   04/20/15 2034 04/21/15 2025 04/22/15 2013  Weight: 68.539 kg (151 lb 1.6 oz) 67.677 kg (149 lb 3.2 oz) 68.927 kg (151 lb 15.3 oz)    History of present illness:  Tanner Owen is a 81 y.o. male with a history of HTN, DM2, Hyperlipidemia, Stage III CKD, RAS, and history of Esophageal Stricture S/P Dilatitions in the past who presents to the Ed with complaints of Nausea and vomiting and ABD Pain x 10 days and being unable to hold down foods and liquids. He has had poor intake of foods and liquids over the past 3 weeks according to his son. In the ED, he was found to be orthostatic, as well as having and increase in his BUN/Cr. He was referred for admission.    Hospital Course:  Esophageal stricture - Gastroenterology consulted, EGD with dilatation done twice, on 8/3 as well as 8/5 -Continue dysphagia 1 diet and encourage PO intake -going home with hospice  Anorexia -Continue Megace 800 mg  daily  Urinary retention - foley d/c'd, patient voiding on own  FTT - due to esophageal stricture  Severe protein calorie malnutrition - Per nutrition continue ensure TID  Essential Hypertension  -Continue propranolol to 10 mg twice  -Increase Norvasc 10mg ,  Bradycardia - stable  Chronic kidney disease stage III - Creatinine is stable at baseline, continue to monitor  Diabetes mellitus controlled - 7/31 hemoglobin A1c = 6.8. -going home on hospice  Hypokalemia - Home hospice  Hypomagnesemia Home hospice  Thrombocytopenia - Acute on chronic, dating as far back as 2012 - Home hospice.    Procedures:    Consultations:  Palliative care  GI  Discharge Exam: Filed Vitals:   04/23/15 0916  BP: 138/63  Pulse: 64  Temp: 98.3 F (36.8 C)  Resp: 18    General: pleasant/cooperative   Discharge Instructions   Discharge Instructions    Discharge instructions    Complete by:  As directed   Home hospice DYS 1 diet     Increase activity slowly    Complete by:  As directed           Current Discharge Medication List    START taking these medications   Details  Amino Acids-Protein Hydrolys (FEEDING SUPPLEMENT, PRO-STAT SUGAR FREE 64,) LIQD Take 30 mLs by mouth 2 (two) times daily. Qty: 900 mL, Refills: 0    amLODipine (NORVASC) 10 MG tablet Take 1 tablet (10 mg total) by mouth daily. Qty: 30 tablet, Refills: 0    feeding supplement, ENSURE  ENLIVE, (ENSURE ENLIVE) LIQD Take 237 mLs by mouth 3 (three) times daily. Qty: 237 mL, Refills: 12    megestrol (MEGACE) 400 MG/10ML suspension Take 20 mLs (800 mg total) by mouth daily. Qty: 240 mL, Refills: 0    metoCLOPramide (REGLAN) 5 MG tablet Take 1 tablet (5 mg total) by mouth 3 (three) times daily before meals. Qty: 90 tablet, Refills: 0      CONTINUE these medications which have CHANGED   Details  propranolol (INDERAL) 10 MG tablet Take 1 tablet (10 mg total) by mouth 2 (two) times daily. Qty:  60 tablet, Refills: 0      CONTINUE these medications which have NOT CHANGED   Details  acetaminophen (TYLENOL) 500 MG tablet Take 500 mg by mouth every 6 (six) hours as needed for moderate pain or headache.    docusate sodium (COLACE) 100 MG capsule Take 100 mg by mouth daily.    escitalopram (LEXAPRO) 10 MG tablet Take 10 mg by mouth daily.    Omega-3 Fatty Acids (FISH OIL PO) Take 5 mLs by mouth daily.    omeprazole (PRILOSEC) 20 MG capsule TAKE TWO CAPSULES BY MOUTH DAILY Qty: 60 capsule, Refills: 3      STOP taking these medications     cephALEXin (KEFLEX) 500 MG capsule        No Known Allergies Follow-up Information    Follow up with Ezequiel Kayser, MD.   Specialty:  Internal Medicine   Why:  As needed   Contact information:   [address] [place] [address] [phone]        The results of significant diagnostics from this hospitalization (including imaging, microbiology, ancillary and laboratory) are listed below for reference.    Significant Diagnostic Studies: Dg Chest 2 View  04/05/2015   CLINICAL DATA:  Increasing generalized weakness over the past few days. Current history of hypertension and diabetes. Former long-time smoker.  EXAM: CHEST  2 VIEW  COMPARISON:  01/06/2012, 01/05/2012.  FINDINGS: Cardiac silhouette normal in size, unchanged. Thoracic aorta mildly tortuous and atherosclerotic, unchanged. Mildly prominent central pulmonary arteries, unchanged. Lungs clear. Bronchovascular markings normal. Pulmonary vascularity normal. No visible pleural effusions. No pneumothorax. Degenerative changes and DISH involving the thoracic spine.  IMPRESSION: No acute cardiopulmonary disease.  Stable examination.   Electronically Signed   By: Hulan Saas M.D.   On: 04/05/2015 20:46   Dg Abd Acute W/chest  04/14/2015   CLINICAL DATA:  Abdominal pain, vomiting.  Malaise.  EXAM: DG ABDOMEN ACUTE W/ 1V CHEST  COMPARISON:  Chest radiograph 04/05/2015  FINDINGS:  The cardiomediastinal contours are unchanged. The lungs are clear. There is no free intra-abdominal air. No dilated bowel loops to suggest obstruction. Moderate volume of stool throughout the colon, with stool ball distending the rectum. No radiopaque calculi. Vascular calcifications of the splenic artery. No acute osseous abnormalities are seen.  IMPRESSION: 1. Moderate volume of colonic stool with stool ball distending the rectum, can be seen with constipation. No bowel obstruction. 2.  No acute pulmonary process.   Electronically Signed   By: Rubye Oaks M.D.   On: 04/14/2015 00:13    Microbiology: No results found for this or any previous visit (from the past 240 hour(s)).   Labs: Basic Metabolic Panel:  Recent Labs Lab 04/17/15 0533 04/18/15 0448 04/19/15 0716 04/20/15 0437 04/20/15 1621 04/21/15 0720  NA 141 138 142 138  --  140  K 3.0* 2.7* 4.4 3.1* 3.2* 3.0*  CL 110 107  --  106  --  106  CO2 22 23  --  24  --  26  GLUCOSE 86 91 87 80  --  81  BUN 21* 22*  --  20  --  22*  CREATININE 1.22 1.28*  --  1.24  --  1.16  CALCIUM 8.6* 8.3*  --  8.0*  --  8.4*  MG  --  1.6*  --   --  2.0 1.9   Liver Function Tests: No results for input(s): AST, ALT, ALKPHOS, BILITOT, PROT, ALBUMIN in the last 168 hours. No results for input(s): LIPASE, AMYLASE in the last 168 hours. No results for input(s): AMMONIA in the last 168 hours. CBC:  Recent Labs Lab 04/17/15 0533 04/18/15 0448 04/19/15 0716 04/20/15 0437 04/21/15 0720  WBC 9.1 7.0  --  10.6* 9.7  HGB 11.9* 10.9* 10.9* 10.8* 11.8*  HCT 34.1* 31.2* 32.0* 31.1* 34.3*  MCV 88.8 87.9  --  89.9 89.1  PLT 114* 103*  --  73* 83*   Cardiac Enzymes: No results for input(s): CKTOTAL, CKMB, CKMBINDEX, TROPONINI in the last 168 hours. BNP: BNP (last 3 results) No results for input(s): BNP in the last 8760 hours.  ProBNP (last 3 results) No results for input(s): PROBNP in the last 8760 hours.  CBG:  Recent Labs Lab  04/22/15 1701 04/22/15 2012 04/23/15 0007 04/23/15 0434 04/23/15 0732  GLUCAP 137* 137* 127* 111* 116*       Signed:  Benjamine Mola, Carianne Taira  [facility] 04/23/2015, 9:24 AM

## 2015-04-23 NOTE — Progress Notes (Signed)
Foley catheter d/c per patient and family request for order. Attempted to help patient void at 0245. Will do a follow-up bladder scan at 0445 and continue to monitor. Littie Deeds RN

## 2015-04-23 NOTE — Progress Notes (Signed)
Pt spontaneously voided but was incontinent pvr bladder scan showed 207 ml of urine. Will continue to monitor. Littie Deeds RN

## 2015-04-23 NOTE — Telephone Encounter (Signed)
Pt had repeat EGD 04/19/15 in hospital. Pt was just discharged today from the hospital with hospice. Does pt still need repeat procedure? Please advise.

## 2015-04-23 NOTE — Progress Notes (Signed)
Tanner Owen to be D/C'd Home per MD order.  Discussed prescriptions and follow up appointments with the patient. Prescriptions given to patient's son, medication list explained in detail. Pt's son verbalized understanding.    Medication List    STOP taking these medications        cephALEXin 500 MG capsule  Commonly known as:  KEFLEX      TAKE these medications        acetaminophen 500 MG tablet  Commonly known as:  TYLENOL  Take 500 mg by mouth every 6 (six) hours as needed for moderate pain or headache.     amLODipine 10 MG tablet  Commonly known as:  NORVASC  Take 1 tablet (10 mg total) by mouth daily.     docusate sodium 100 MG capsule  Commonly known as:  COLACE  Take 100 mg by mouth daily.     escitalopram 10 MG tablet  Commonly known as:  LEXAPRO  Take 10 mg by mouth daily.     feeding supplement (ENSURE ENLIVE) Liqd  Take 237 mLs by mouth 3 (three) times daily.     feeding supplement (PRO-STAT SUGAR FREE 64) Liqd  Take 30 mLs by mouth 2 (two) times daily.     FISH OIL PO  Take 5 mLs by mouth daily.     megestrol 400 MG/10ML suspension  Commonly known as:  MEGACE  Take 20 mLs (800 mg total) by mouth daily.     metoCLOPramide 5 MG tablet  Commonly known as:  REGLAN  Take 1 tablet (5 mg total) by mouth 3 (three) times daily before meals.     omeprazole 20 MG capsule  Commonly known as:  PRILOSEC  TAKE TWO CAPSULES BY MOUTH DAILY     propranolol 10 MG tablet  Commonly known as:  INDERAL  Take 1 tablet (10 mg total) by mouth 2 (two) times daily.        Filed Vitals:   04/23/15 0916  BP: 138/63  Pulse: 64  Temp: 98.3 F (36.8 C)  Resp: 18    Skin clean, dry and intact without evidence of skin break down, no evidence of skin tears noted. IV catheter discontinued intact. Site without signs and symptoms of complications. Dressing and pressure applied. Pt denies pain at this time. No complaints noted.  An After Visit Summary was printed and given  to the patient's son. Patient escorted via WC, and D/C home via private auto.  [name] A 04/23/2015 11:34 AM

## 2015-04-30 NOTE — Telephone Encounter (Signed)
Left message for pt to call back  °

## 2015-04-30 NOTE — Telephone Encounter (Signed)
Tanner Owen, talk to the patient if appropriate or one of his children if they are the health power of attorney see what they want to do. Thanks. In the meantime, do not put him on my [facility] schedule as he has just had recent adequate dilation

## 2015-04-30 NOTE — Telephone Encounter (Signed)
Spoke with pts son and they want to hold off on scheduling another EGD with dil. Encouraged pts son to call back if they changed their mind or had any other concerns.

## 2015-04-30 NOTE — Telephone Encounter (Signed)
Noted! Thank you

## 2015-05-01 ENCOUNTER — Encounter: Payer: Medicare Other | Admitting: Internal Medicine

## 2015-05-16 DEATH — deceased

## 2015-09-25 ENCOUNTER — Encounter: Payer: Self-pay | Admitting: Internal Medicine

## 2015-09-26 ENCOUNTER — Encounter: Payer: Self-pay | Admitting: Internal Medicine

## 2016-10-22 IMAGING — CR DG CHEST 2V
2 series · 2 of 2 positions shown · non-contrast
Comparison: 01/06/2012, 01/05/2012.

CLINICAL DATA: Increasing generalized weakness over the past few
days. Current history of hypertension and diabetes. Former long-time
smoker.

EXAM:
CHEST  2 VIEW

[w chest pa]
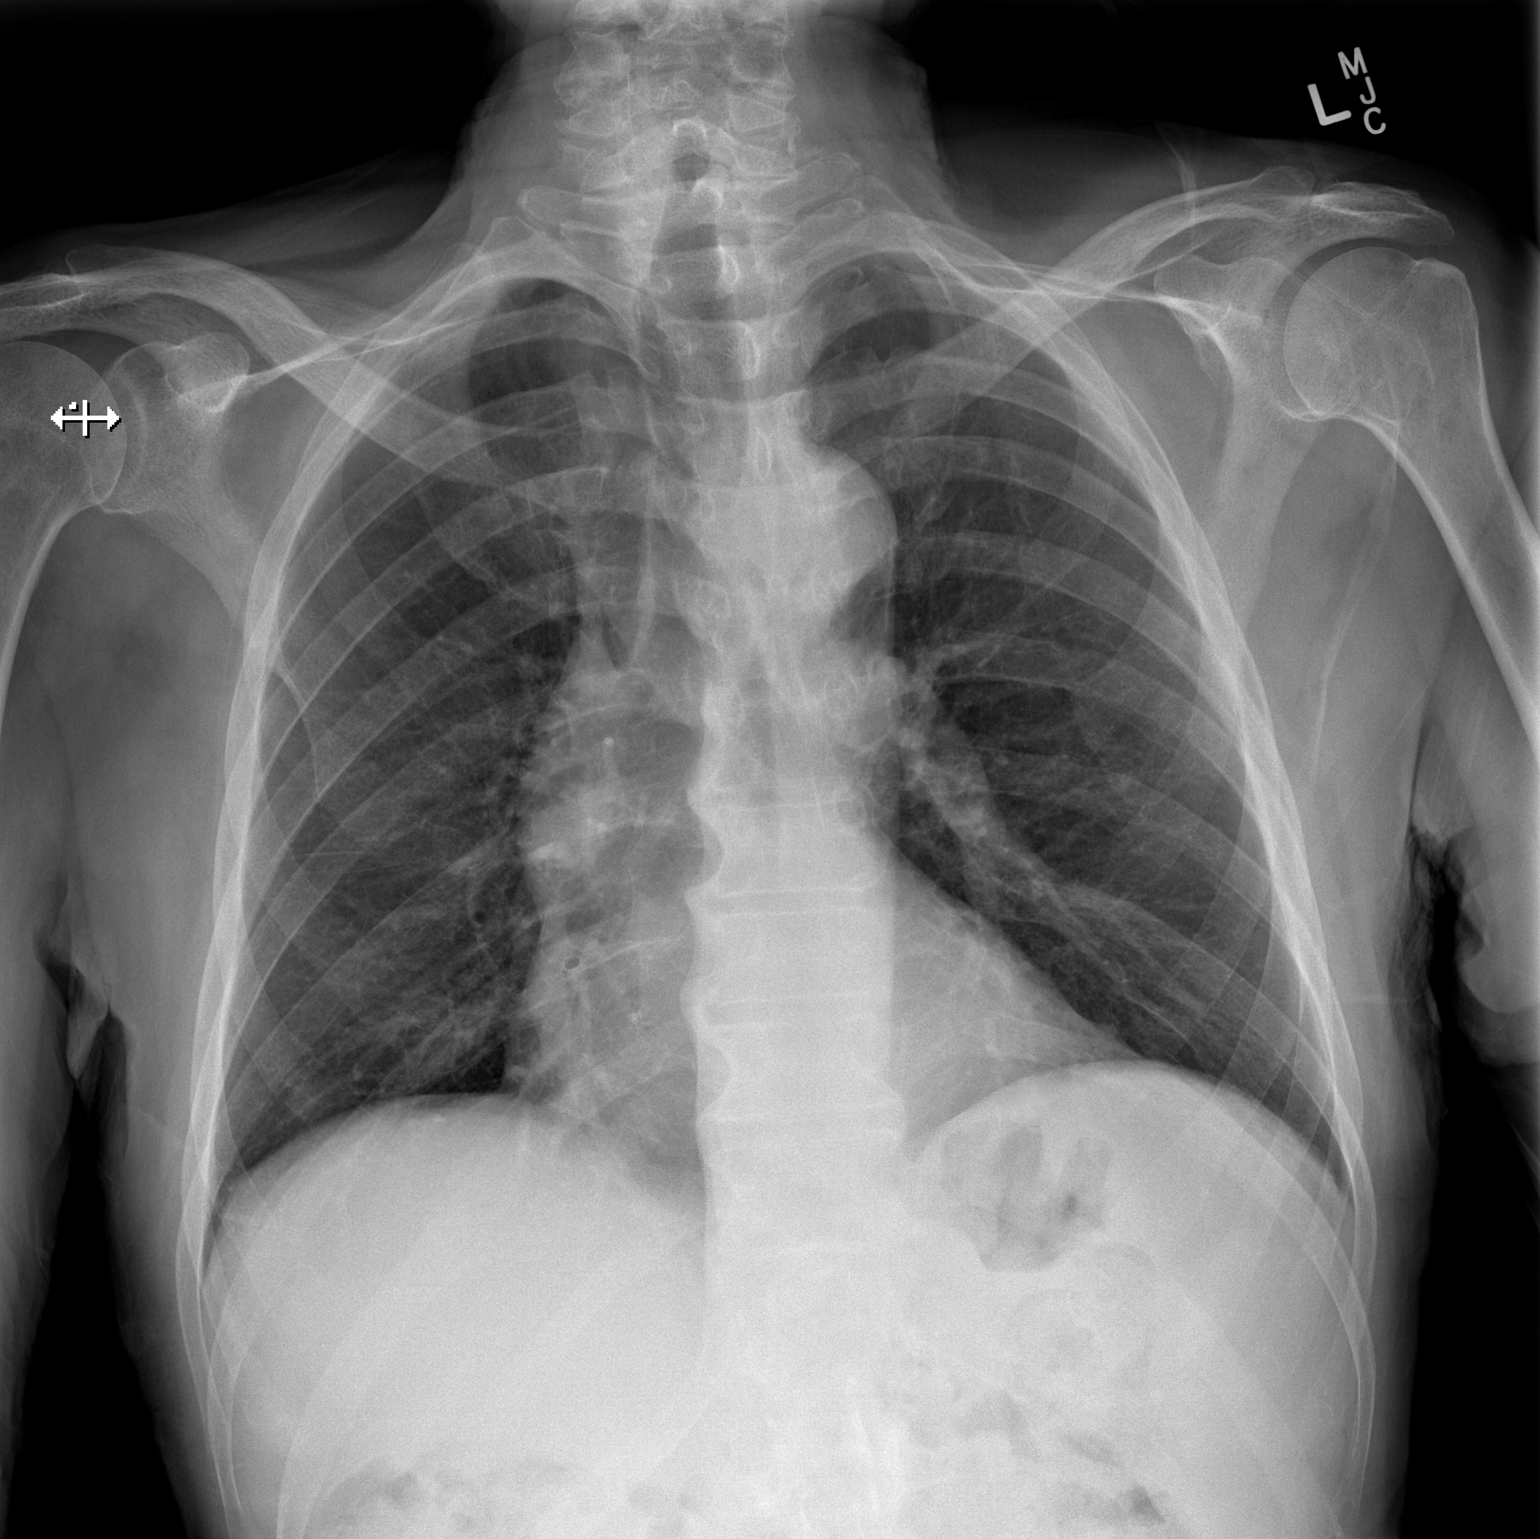

[w chest lat]
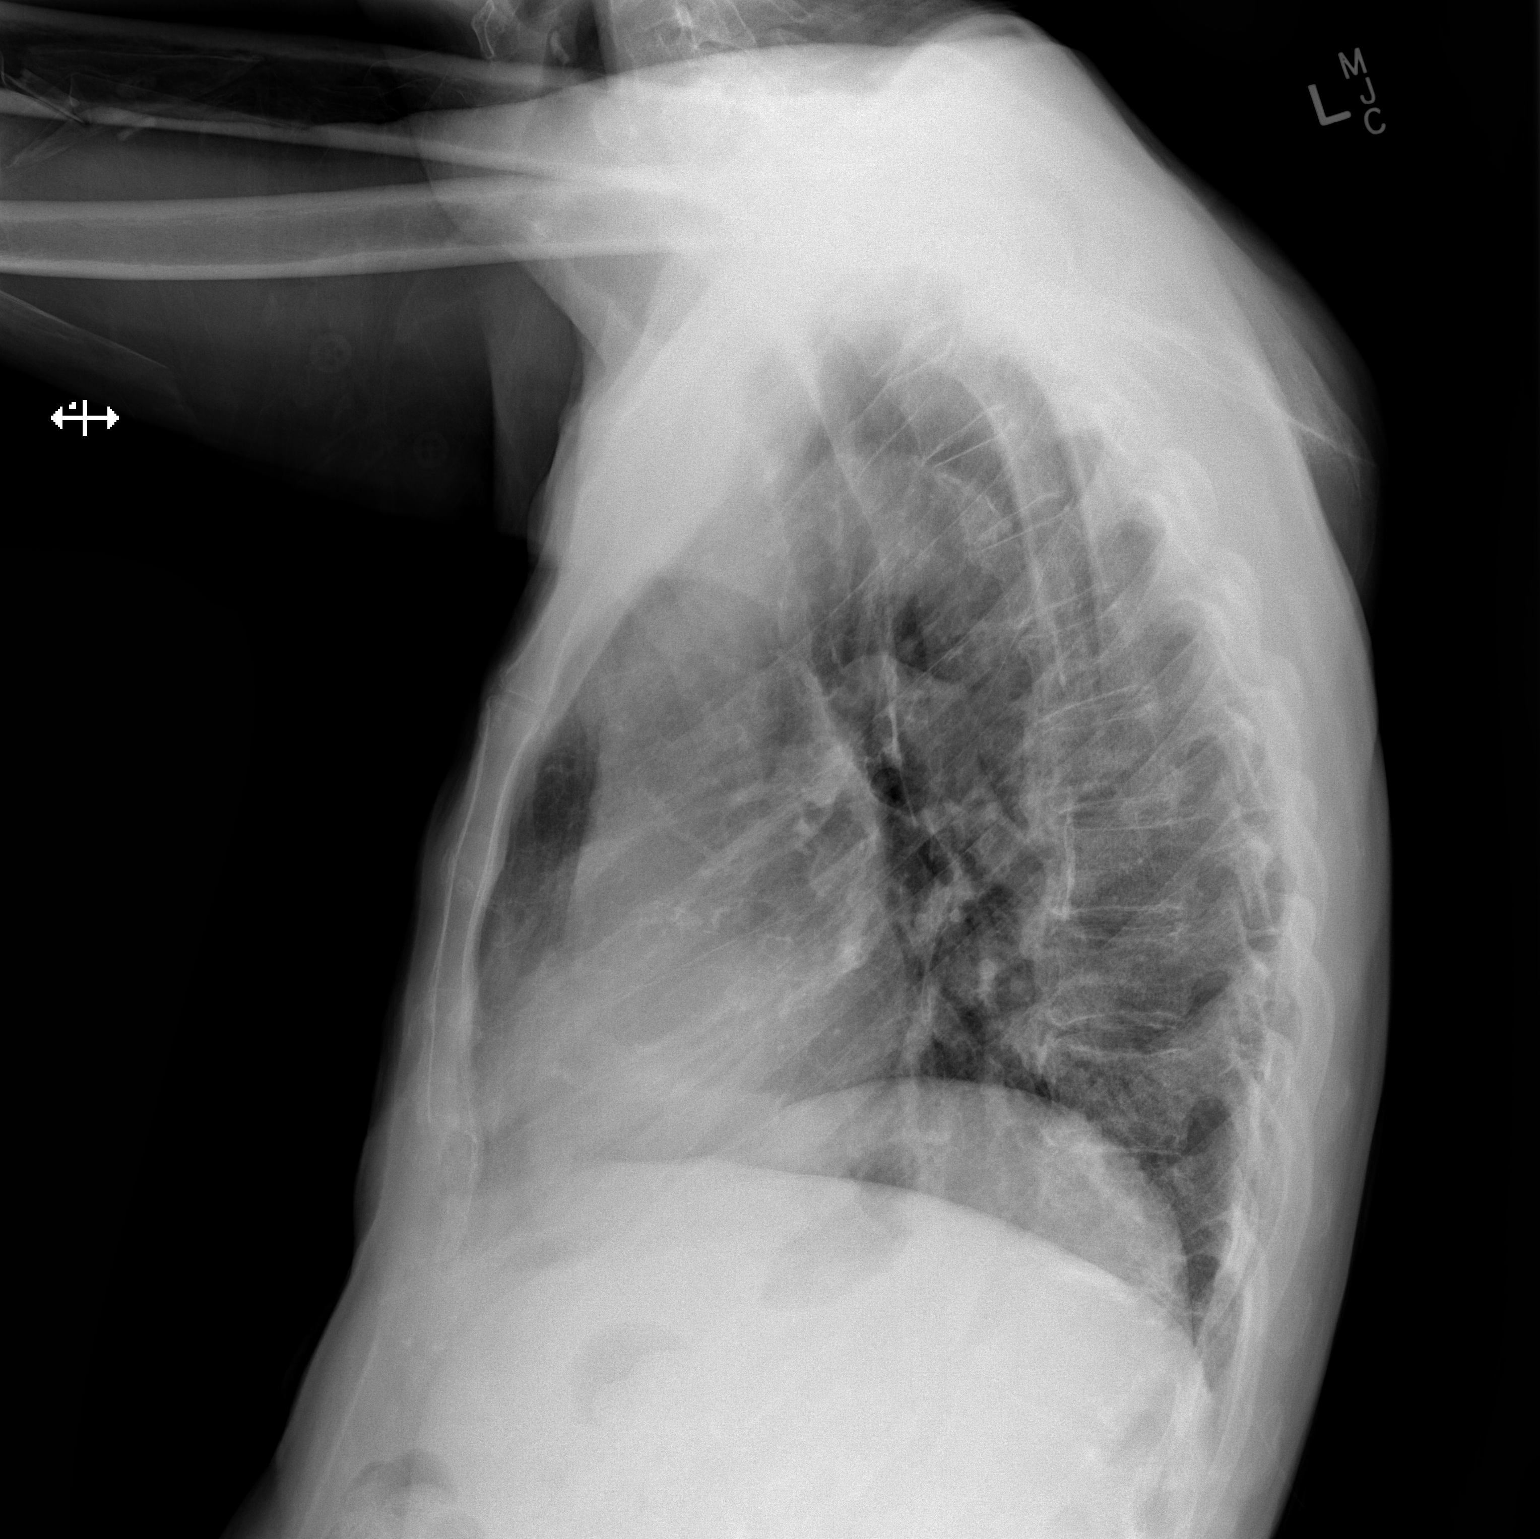

[2 of 2 positions shown; findings below may reference images not displayed]

FINDINGS: Cardiac silhouette normal in size, unchanged. Thoracic aorta mildly
tortuous and atherosclerotic, unchanged. Mildly prominent central
pulmonary arteries, unchanged. Lungs clear. Bronchovascular markings
normal. Pulmonary vascularity normal. No visible pleural effusions.
No pneumothorax. Degenerative changes and DISH involving the
thoracic spine.
IMPRESSION: No acute cardiopulmonary disease.  Stable examination.

## 2016-10-30 IMAGING — DX DG ABDOMEN ACUTE W/ 1V CHEST
3 series · 3 of 3 positions shown · non-contrast
Comparison: Chest radiograph 04/05/2015

CLINICAL DATA: Abdominal pain, vomiting.  Malaise.

EXAM:
DG ABDOMEN ACUTE W/ 1V CHEST

[abdomen supine]
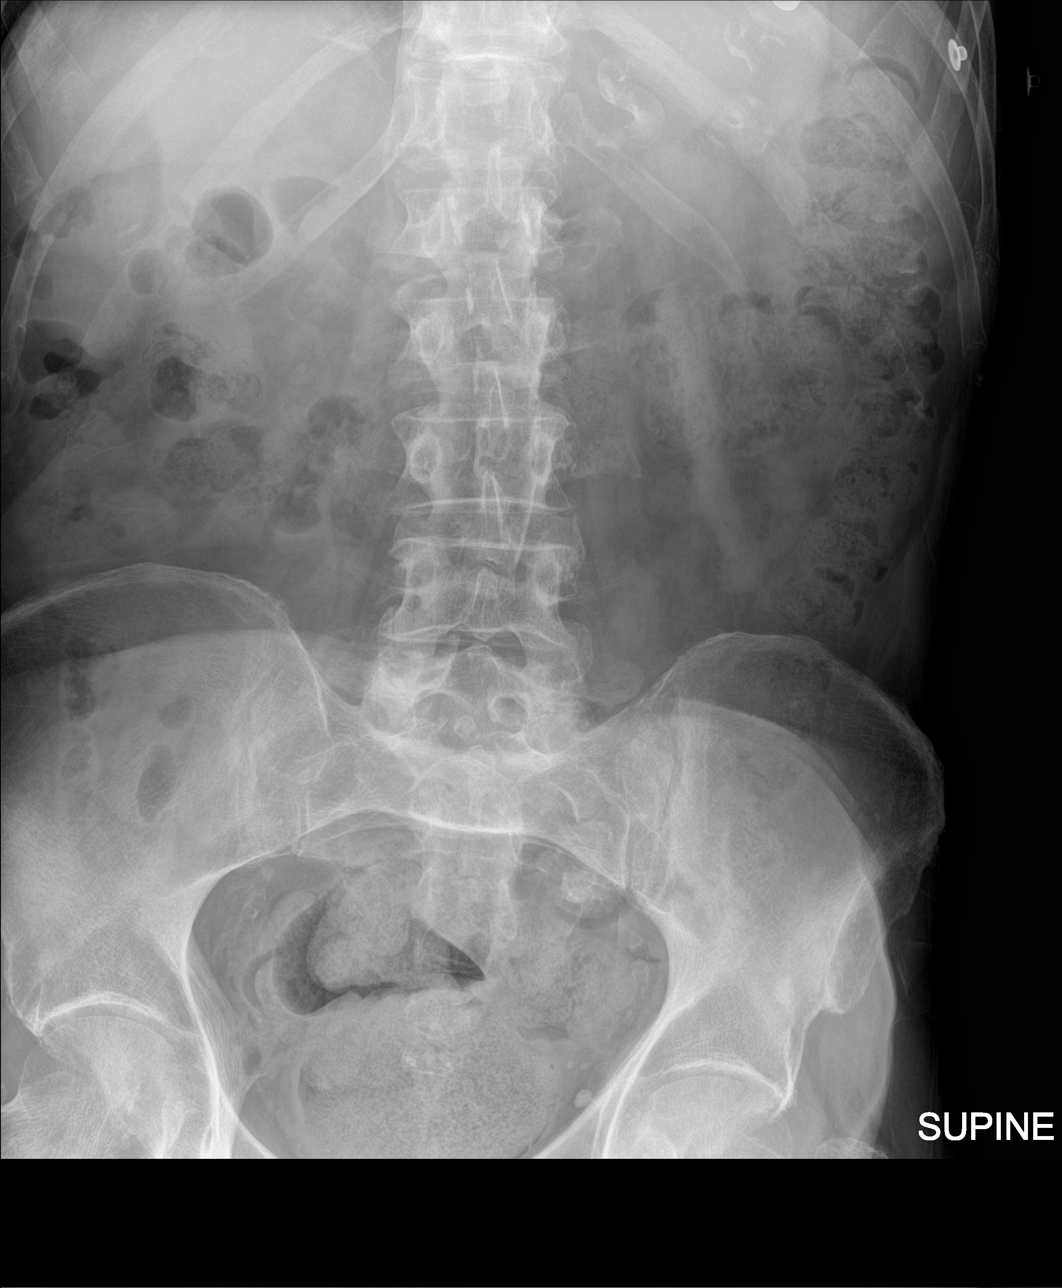

[abdomen decu]
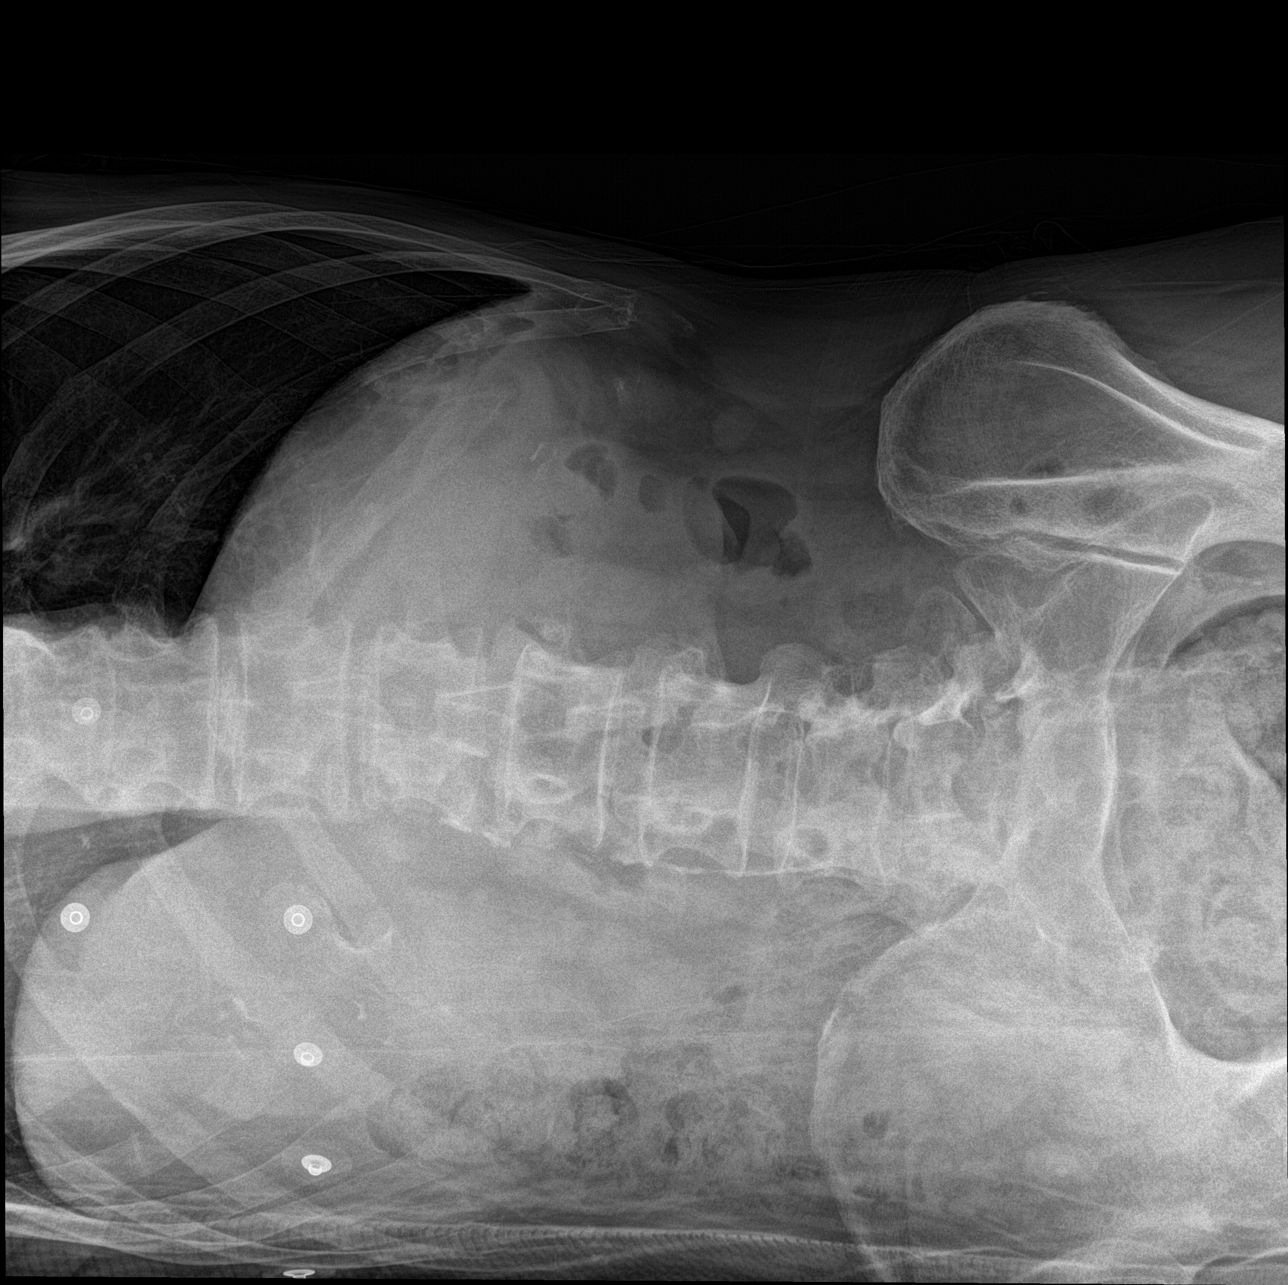

[chest ap]
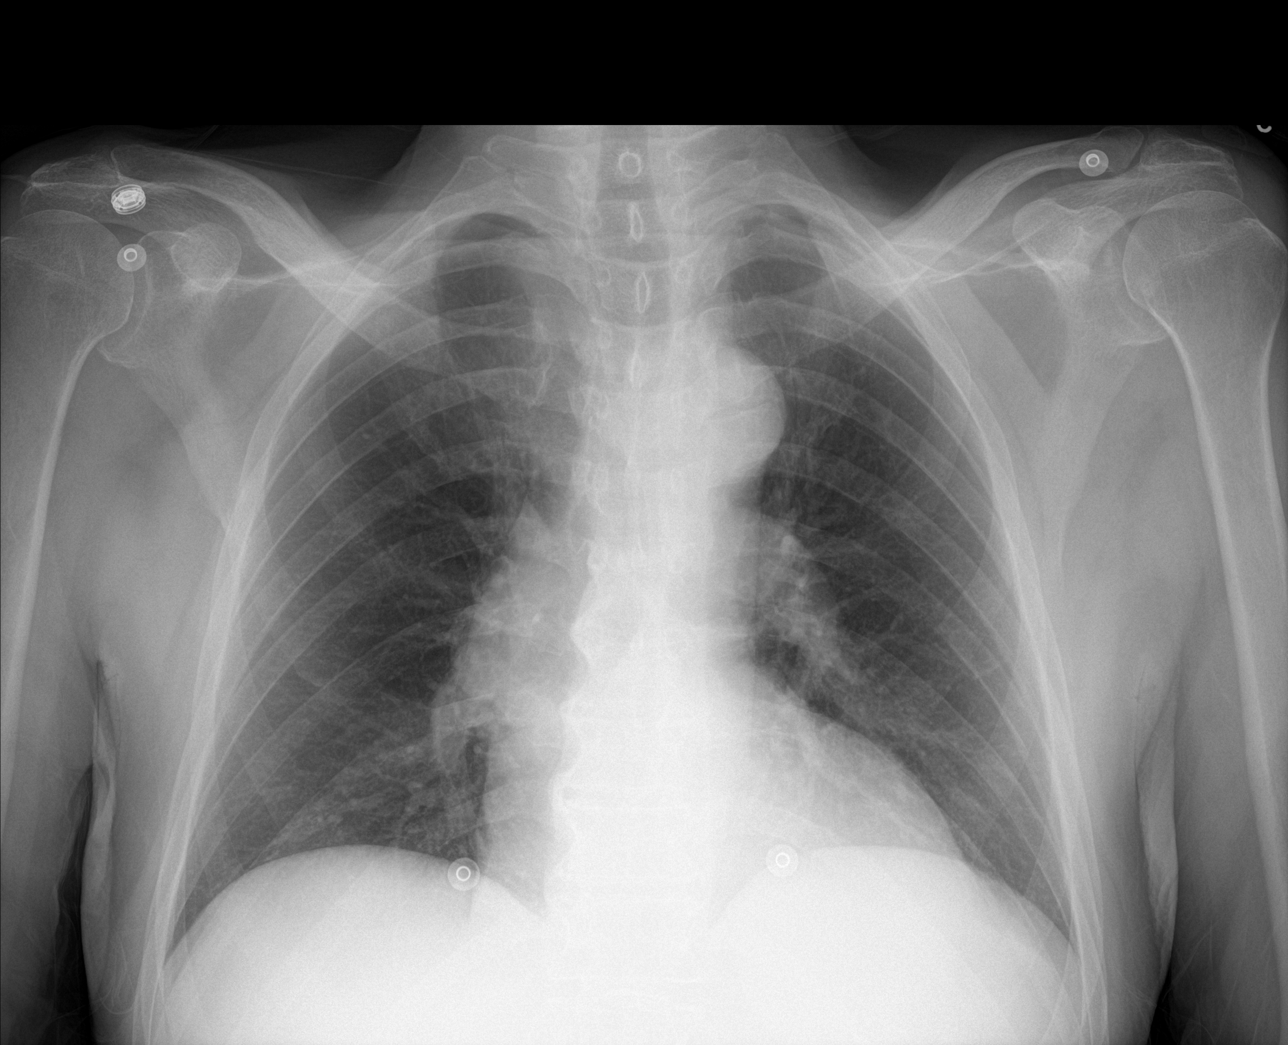

[3 of 3 positions shown; findings below may reference images not displayed]

FINDINGS: The cardiomediastinal contours are unchanged. The lungs are clear.
There is no free intra-abdominal air. No dilated bowel loops to
suggest obstruction. Moderate volume of stool throughout the colon,
with stool ball distending the rectum. No radiopaque calculi.
Vascular calcifications of the splenic artery. No acute osseous
abnormalities are seen.
IMPRESSION: 1. Moderate volume of colonic stool with stool ball distending the
rectum, can be seen with constipation. No bowel obstruction.
2.  No acute pulmonary process.
